# Patient Record
Sex: Female | Born: 1955 | Race: White | Hispanic: No | Marital: Married | Smoking: Never smoker
Health system: Southern US, Community
[De-identification: ages and names within clinical notes are randomized; demographics above are authoritative.]

## PROBLEM LIST (undated history)

## (undated) DIAGNOSIS — M1990 Unspecified osteoarthritis, unspecified site: Secondary | ICD-10-CM

## (undated) DIAGNOSIS — Z803 Family history of malignant neoplasm of breast: Secondary | ICD-10-CM

## (undated) DIAGNOSIS — Z923 Personal history of irradiation: Secondary | ICD-10-CM

## (undated) DIAGNOSIS — G43909 Migraine, unspecified, not intractable, without status migrainosus: Secondary | ICD-10-CM

## (undated) DIAGNOSIS — Z8042 Family history of malignant neoplasm of prostate: Secondary | ICD-10-CM

## (undated) DIAGNOSIS — E079 Disorder of thyroid, unspecified: Secondary | ICD-10-CM

## (undated) DIAGNOSIS — Z5189 Encounter for other specified aftercare: Secondary | ICD-10-CM

## (undated) DIAGNOSIS — J189 Pneumonia, unspecified organism: Secondary | ICD-10-CM

## (undated) DIAGNOSIS — E039 Hypothyroidism, unspecified: Secondary | ICD-10-CM

## (undated) DIAGNOSIS — Z800 Family history of malignant neoplasm of digestive organs: Secondary | ICD-10-CM

## (undated) DIAGNOSIS — C50919 Malignant neoplasm of unspecified site of unspecified female breast: Secondary | ICD-10-CM

## (undated) DIAGNOSIS — C50911 Malignant neoplasm of unspecified site of right female breast: Secondary | ICD-10-CM

## (undated) HISTORY — DX: Family history of malignant neoplasm of breast: Z80.3

## (undated) HISTORY — DX: Family history of malignant neoplasm of digestive organs: Z80.0

## (undated) HISTORY — DX: Encounter for other specified aftercare: Z51.89

## (undated) HISTORY — DX: Family history of malignant neoplasm of prostate: Z80.42

## (undated) HISTORY — DX: Disorder of thyroid, unspecified: E07.9

## (undated) HISTORY — PX: BREAST BIOPSY: SHX20

---
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
## 1984-08-13 DIAGNOSIS — IMO0001 Reserved for inherently not codable concepts without codable children: Secondary | ICD-10-CM

## 1984-08-13 DIAGNOSIS — Z5189 Encounter for other specified aftercare: Secondary | ICD-10-CM

## 1984-08-13 HISTORY — DX: Reserved for inherently not codable concepts without codable children: IMO0001

## 1984-08-13 HISTORY — PX: ECTOPIC PREGNANCY SURGERY: SHX613

## 1984-08-13 HISTORY — DX: Encounter for other specified aftercare: Z51.89

## 1998-05-02 ENCOUNTER — Other Ambulatory Visit: Admission: RE | Admit: 1998-05-02 | Discharge: 1998-05-02 | Payer: Self-pay | Admitting: Obstetrics and Gynecology

## 2000-01-16 ENCOUNTER — Encounter: Payer: Self-pay | Admitting: Obstetrics and Gynecology

## 2000-01-16 ENCOUNTER — Encounter: Admission: RE | Admit: 2000-01-16 | Discharge: 2000-01-16 | Payer: Self-pay | Admitting: Obstetrics and Gynecology

## 2001-01-22 ENCOUNTER — Encounter: Payer: Self-pay | Admitting: Obstetrics and Gynecology

## 2001-01-22 ENCOUNTER — Encounter: Admission: RE | Admit: 2001-01-22 | Discharge: 2001-01-22 | Payer: Self-pay | Admitting: Obstetrics and Gynecology

## 2002-02-18 ENCOUNTER — Encounter: Payer: Self-pay | Admitting: Obstetrics and Gynecology

## 2002-02-18 ENCOUNTER — Encounter: Admission: RE | Admit: 2002-02-18 | Discharge: 2002-02-18 | Payer: Self-pay | Admitting: Obstetrics and Gynecology

## 2003-02-22 ENCOUNTER — Encounter: Admission: RE | Admit: 2003-02-22 | Discharge: 2003-02-22 | Payer: Self-pay | Admitting: Obstetrics and Gynecology

## 2003-02-22 ENCOUNTER — Encounter: Payer: Self-pay | Admitting: Obstetrics and Gynecology

## 2004-02-28 ENCOUNTER — Encounter: Admission: RE | Admit: 2004-02-28 | Discharge: 2004-02-28 | Payer: Self-pay | Admitting: Obstetrics and Gynecology

## 2005-03-02 ENCOUNTER — Encounter: Admission: RE | Admit: 2005-03-02 | Discharge: 2005-03-02 | Payer: Self-pay | Admitting: Obstetrics and Gynecology

## 2005-03-15 ENCOUNTER — Encounter: Admission: RE | Admit: 2005-03-15 | Discharge: 2005-03-15 | Payer: Self-pay | Admitting: Obstetrics and Gynecology

## 2006-03-08 ENCOUNTER — Encounter: Admission: RE | Admit: 2006-03-08 | Discharge: 2006-03-08 | Payer: Self-pay | Admitting: Obstetrics and Gynecology

## 2006-10-07 ENCOUNTER — Encounter: Admission: RE | Admit: 2006-10-07 | Discharge: 2006-10-07 | Payer: Self-pay | Admitting: Orthopedic Surgery

## 2007-03-10 ENCOUNTER — Encounter: Admission: RE | Admit: 2007-03-10 | Discharge: 2007-03-10 | Payer: Self-pay | Admitting: Obstetrics and Gynecology

## 2007-08-14 DIAGNOSIS — Z923 Personal history of irradiation: Secondary | ICD-10-CM

## 2007-08-14 HISTORY — DX: Personal history of irradiation: Z92.3

## 2007-08-14 HISTORY — PX: BREAST LUMPECTOMY: SHX2

## 2008-03-10 ENCOUNTER — Encounter: Admission: RE | Admit: 2008-03-10 | Discharge: 2008-03-10 | Payer: Self-pay | Admitting: Obstetrics and Gynecology

## 2008-03-18 ENCOUNTER — Encounter: Admission: RE | Admit: 2008-03-18 | Discharge: 2008-03-18 | Payer: Self-pay | Admitting: Obstetrics and Gynecology

## 2008-03-18 ENCOUNTER — Encounter (INDEPENDENT_AMBULATORY_CARE_PROVIDER_SITE_OTHER): Payer: Self-pay | Admitting: Diagnostic Radiology

## 2008-03-30 ENCOUNTER — Encounter: Admission: RE | Admit: 2008-03-30 | Discharge: 2008-03-30 | Payer: Self-pay | Admitting: Obstetrics and Gynecology

## 2008-04-21 ENCOUNTER — Ambulatory Visit (HOSPITAL_BASED_OUTPATIENT_CLINIC_OR_DEPARTMENT_OTHER): Admission: RE | Admit: 2008-04-21 | Discharge: 2008-04-21 | Payer: Self-pay | Admitting: General Surgery

## 2008-04-21 ENCOUNTER — Encounter (INDEPENDENT_AMBULATORY_CARE_PROVIDER_SITE_OTHER): Payer: Self-pay | Admitting: General Surgery

## 2008-04-21 ENCOUNTER — Encounter: Admission: RE | Admit: 2008-04-21 | Discharge: 2008-04-21 | Payer: Self-pay | Admitting: General Surgery

## 2008-05-10 ENCOUNTER — Ambulatory Visit: Payer: Self-pay | Admitting: Oncology

## 2008-05-25 ENCOUNTER — Ambulatory Visit: Admission: RE | Admit: 2008-05-25 | Discharge: 2008-08-03 | Payer: Self-pay | Admitting: Radiation Oncology

## 2008-07-19 ENCOUNTER — Ambulatory Visit: Payer: Self-pay | Admitting: Oncology

## 2008-07-21 LAB — COMPREHENSIVE METABOLIC PANEL
ALT: 16 U/L (ref 0–35)
Albumin: 4.4 g/dL (ref 3.5–5.2)
Alkaline Phosphatase: 53 U/L (ref 39–117)
CO2: 25 mEq/L (ref 19–32)
Calcium: 9.8 mg/dL (ref 8.4–10.5)
Creatinine, Ser: 0.82 mg/dL (ref 0.40–1.20)
Glucose, Bld: 66 mg/dL — ABNORMAL LOW (ref 70–99)
Potassium: 4.3 mEq/L (ref 3.5–5.3)

## 2008-07-21 LAB — CBC WITH DIFFERENTIAL/PLATELET
Basophils Absolute: 0 10*3/uL (ref 0.0–0.1)
EOS%: 4.4 % (ref 0.0–7.0)
MCH: 30.1 pg (ref 26.0–34.0)
MONO#: 0.3 10*3/uL (ref 0.1–0.9)
MONO%: 7.6 % (ref 0.0–13.0)
NEUT#: 2.6 10*3/uL (ref 1.5–6.5)
Platelets: 292 10*3/uL (ref 145–400)
RDW: 13.7 % (ref 11.3–14.5)
WBC: 4 10*3/uL (ref 3.9–10.0)

## 2009-03-25 ENCOUNTER — Encounter: Admission: RE | Admit: 2009-03-25 | Discharge: 2009-03-25 | Payer: Self-pay | Admitting: General Surgery

## 2009-08-25 IMAGING — MG MM SCREEN MAMMOGRAM BILATERAL
4 series · 4 of 4 positions shown · non-contrast
Comparison: none

DG SCREEN MAMMOGRAM BILATERAL
Bilateral CC and MLO view(s) were taken.

DIGITAL SCREENING MAMMOGRAM WITH CAD:
There are scattered fibroglandular densities.  Microcalcifications are present in the right breast.
Characterization with magnification views is recommended.  No mass or malignant type 
calcifications are identified in the left breast.  Compared with prior studies.

[R CC]
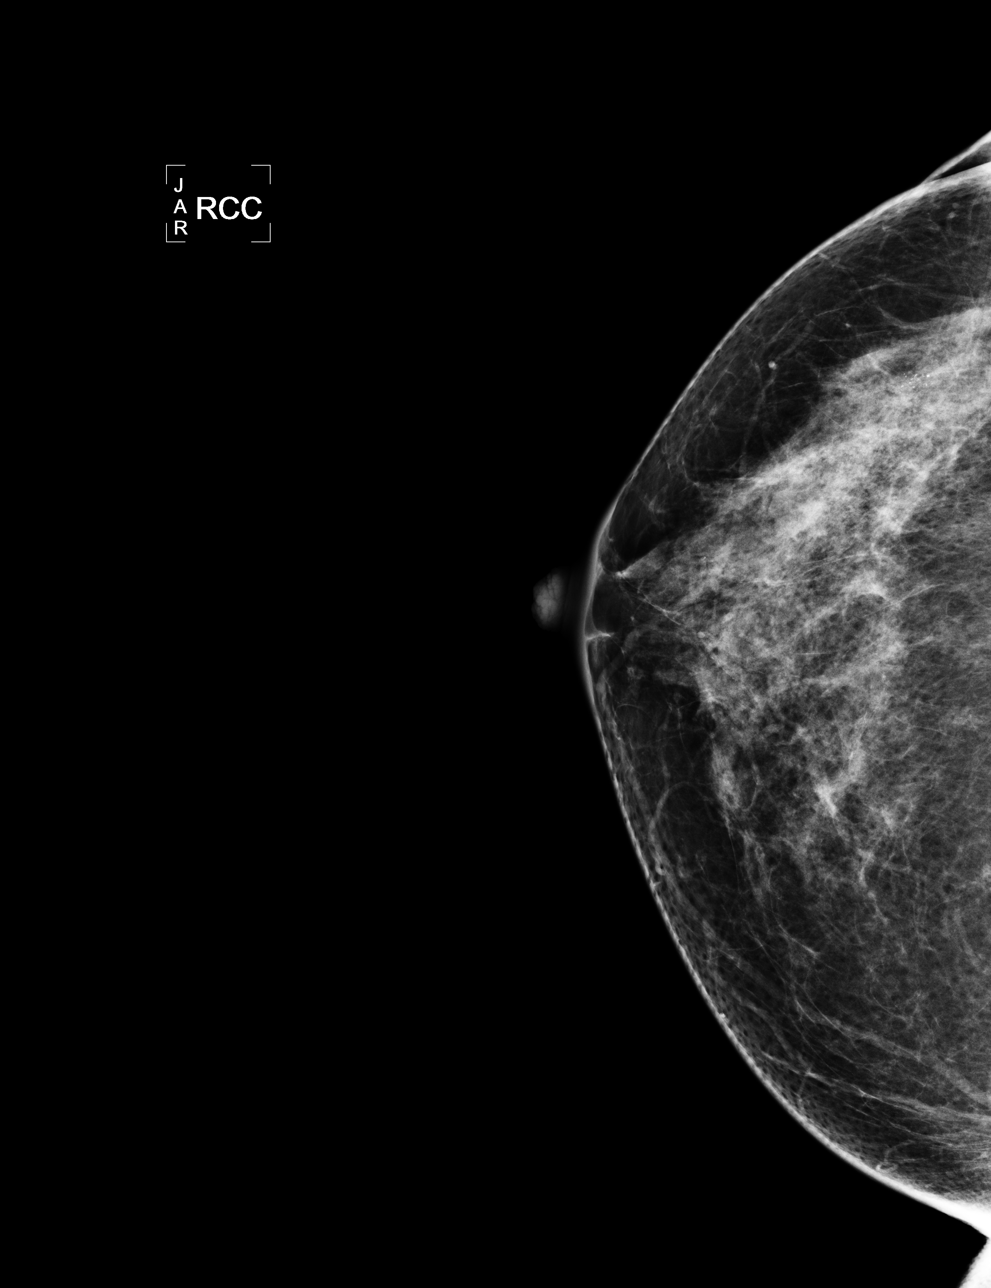

[L CC]
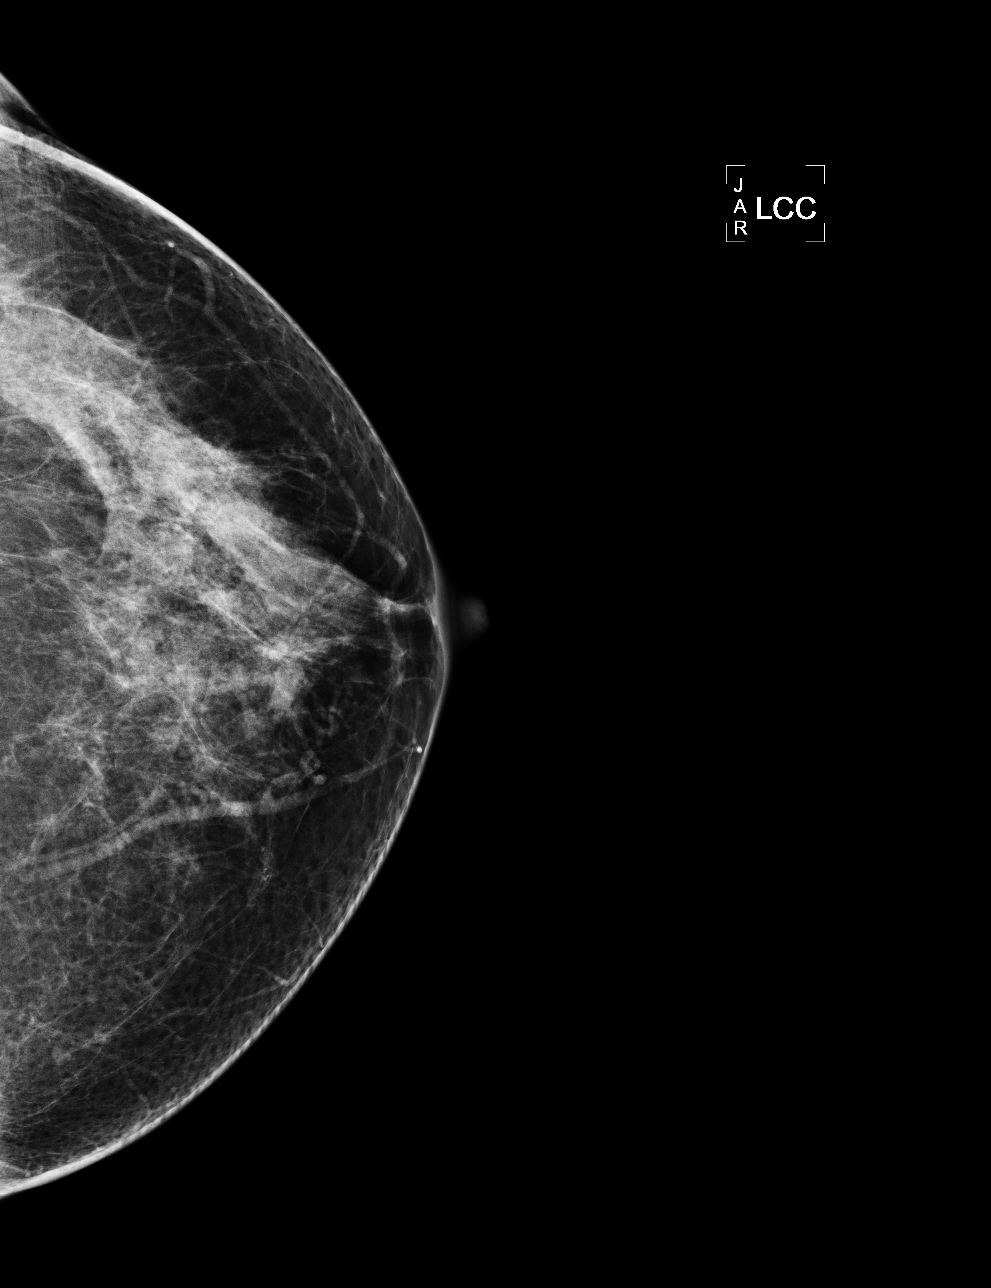

[L MLO]
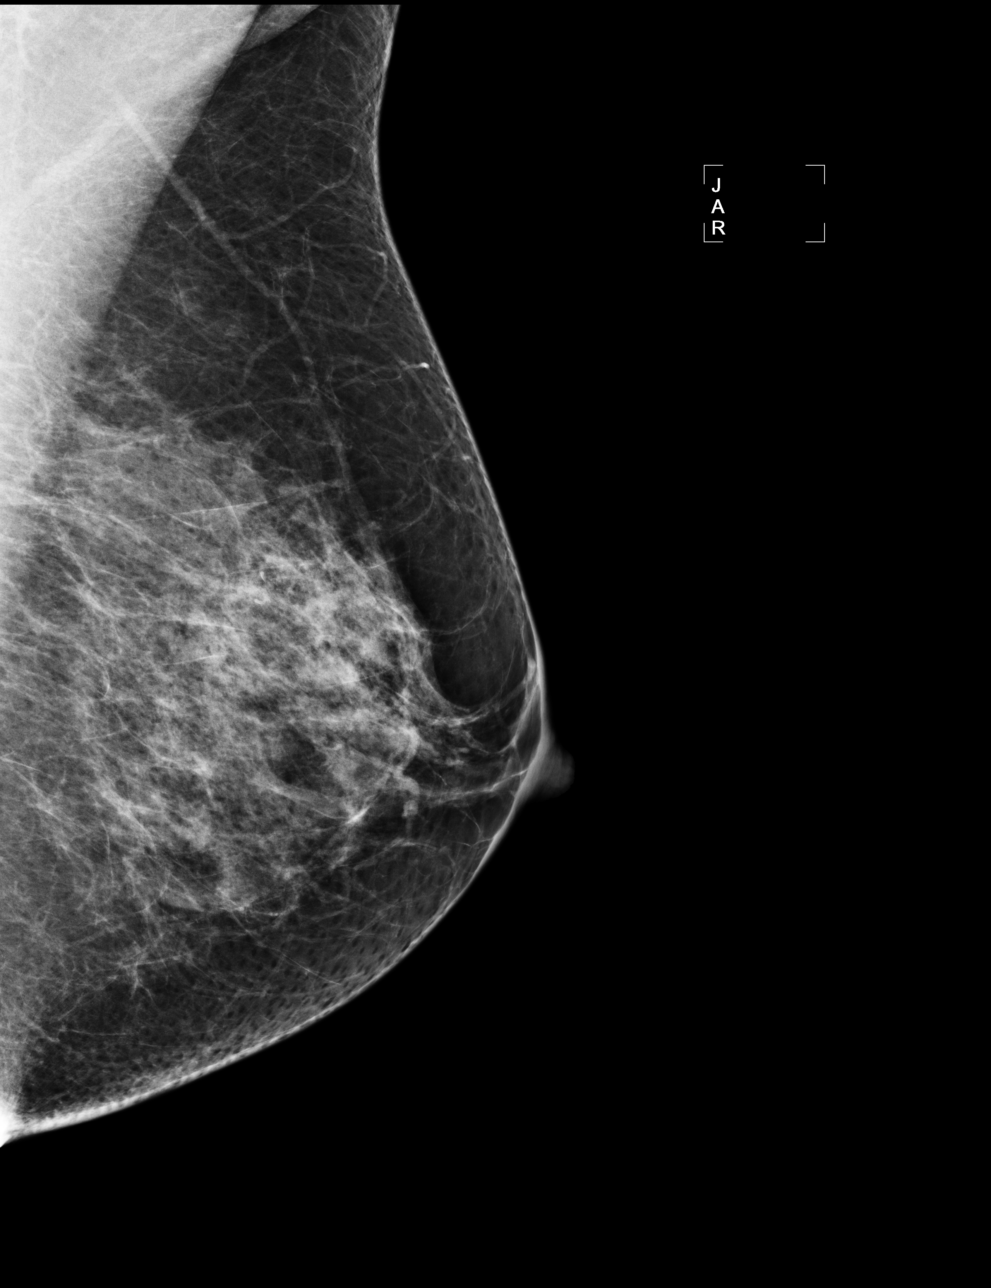

[R MLO]
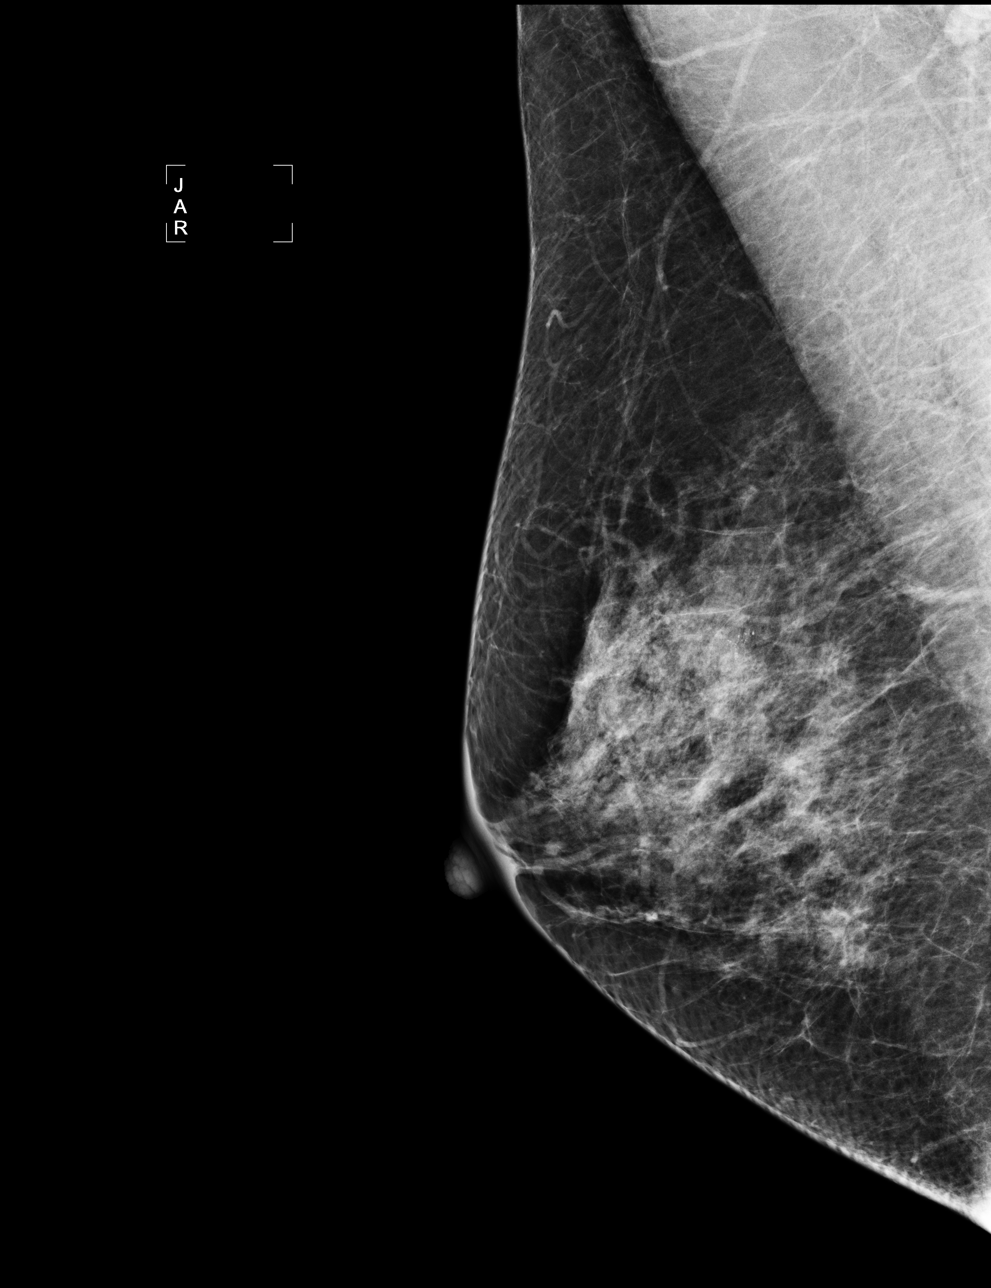

[4 of 4 positions shown; findings below may reference images not displayed]

IMPRESSION: Calcifications, right breast.  Additional evaluation is indicated. The patient will be contacted 
for additional studies and a supplementary report will follow.  No specific mammographic evidence 
of malignancy, left breast.

ASSESSMENT: Need additional imaging evaluation and/or prior mammograms for comparison - BI-RADS 0

Further imaging of the right breast.
ANALYZED BY COMPUTER AIDED DETECTION. , THIS PROCEDURE WAS A DIGITAL MAMMOGRAM.

## 2009-09-02 IMAGING — MG MM DIAGNOSTIC LTD RIGHT
3 series · 3 of 3 positions shown · non-contrast
Comparison: 03/10/2007 and 03/08/2006

CLINICAL DATA: The patient returns for evaluation of calcifications
in the right breast noted on recent screening study dated
03/10/2008.

DIGITAL DIAGNOSTIC RIGHT LIMITED MAMMOGRAM ]

[R CC (1 of 2)]
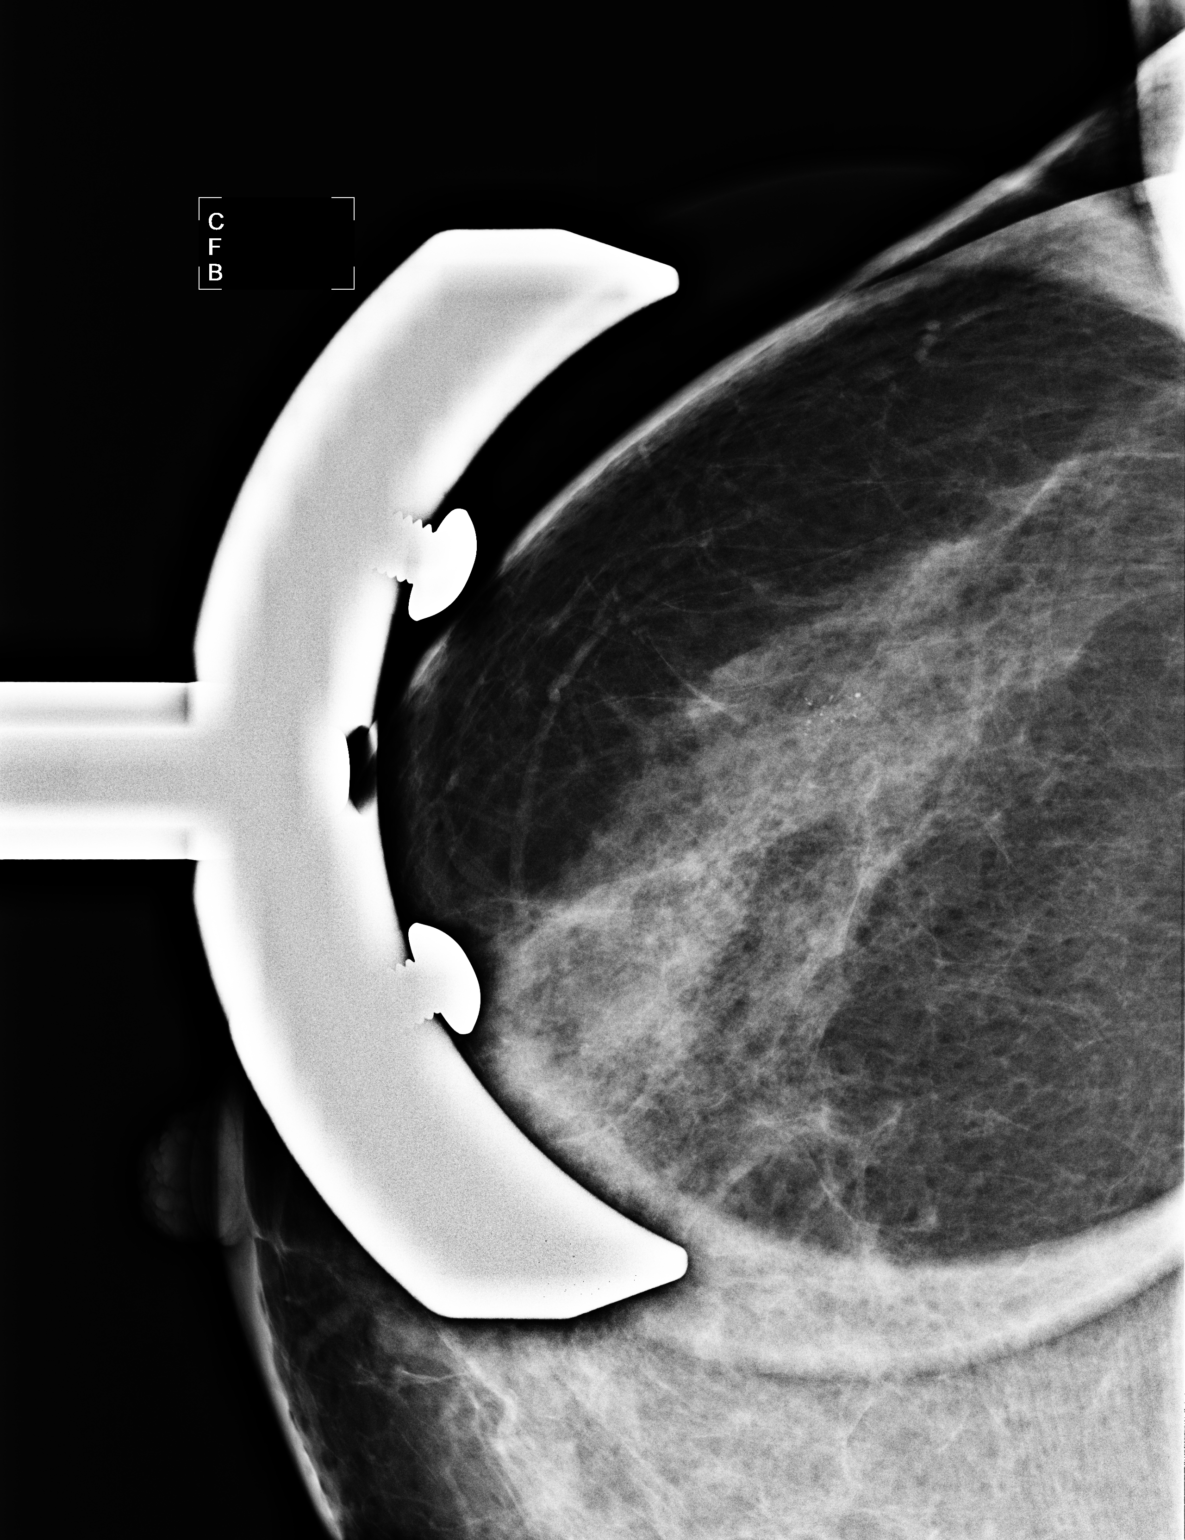

[R CC (2 of 2)]
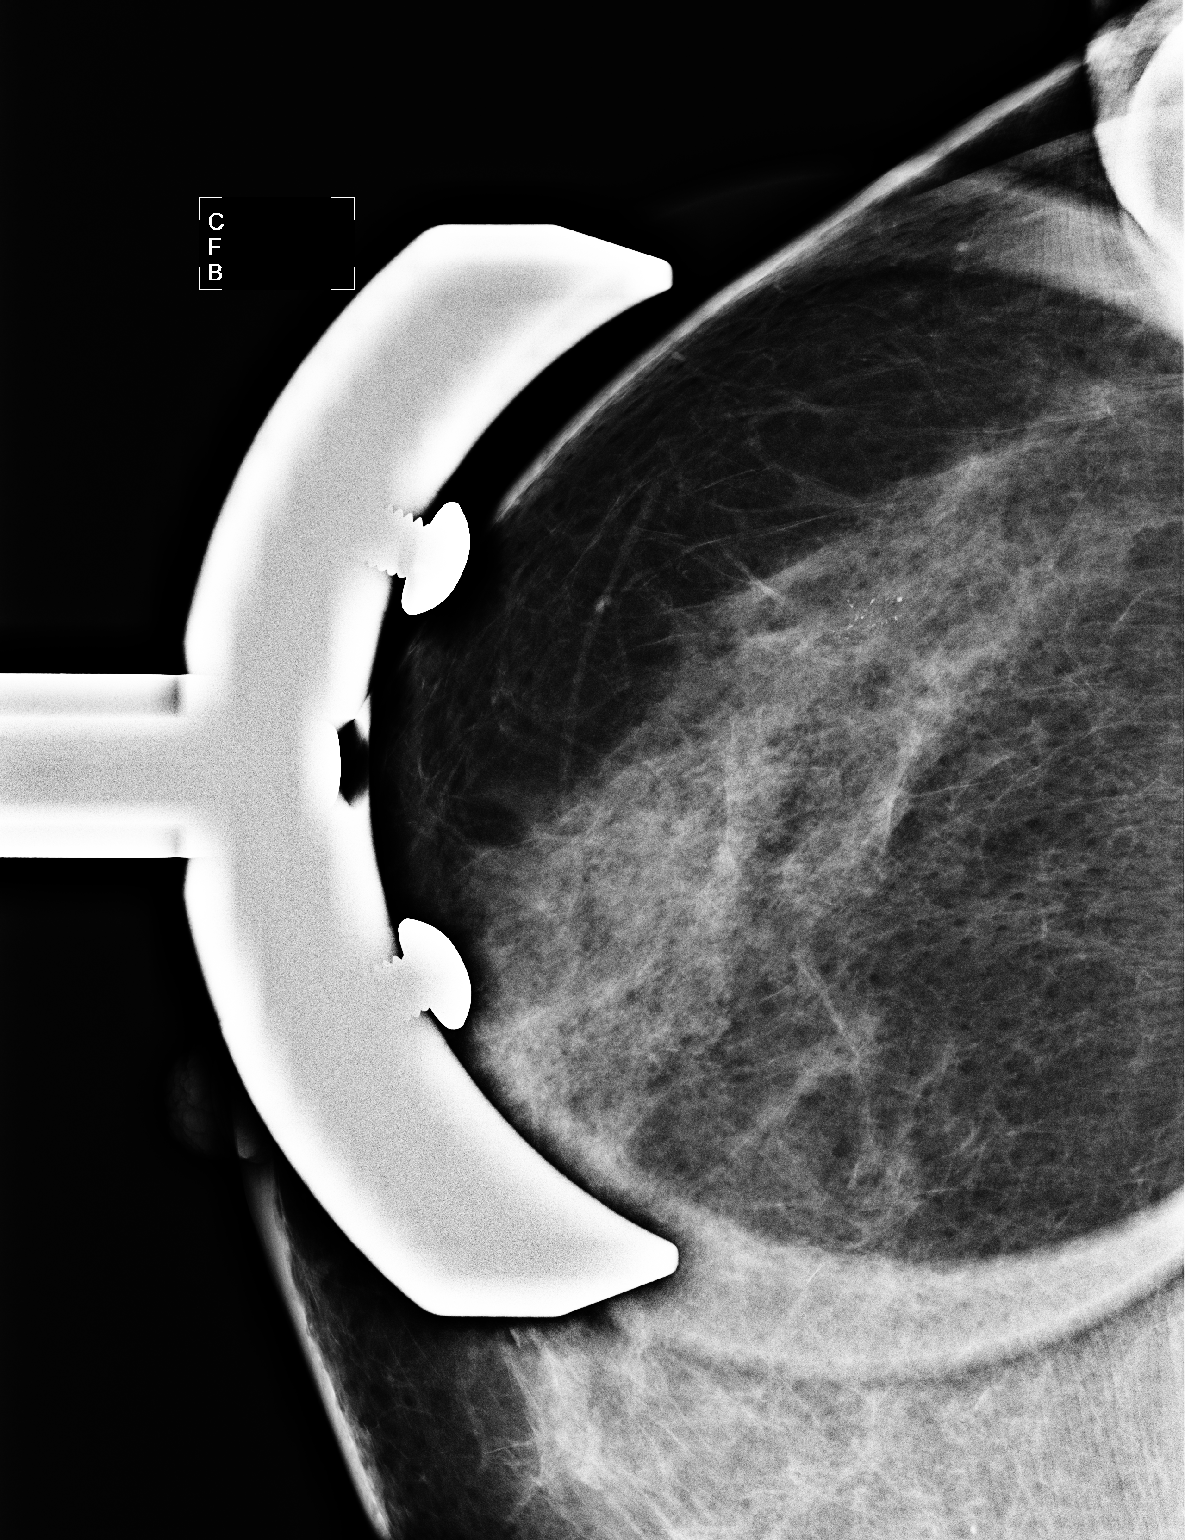

[R ML]
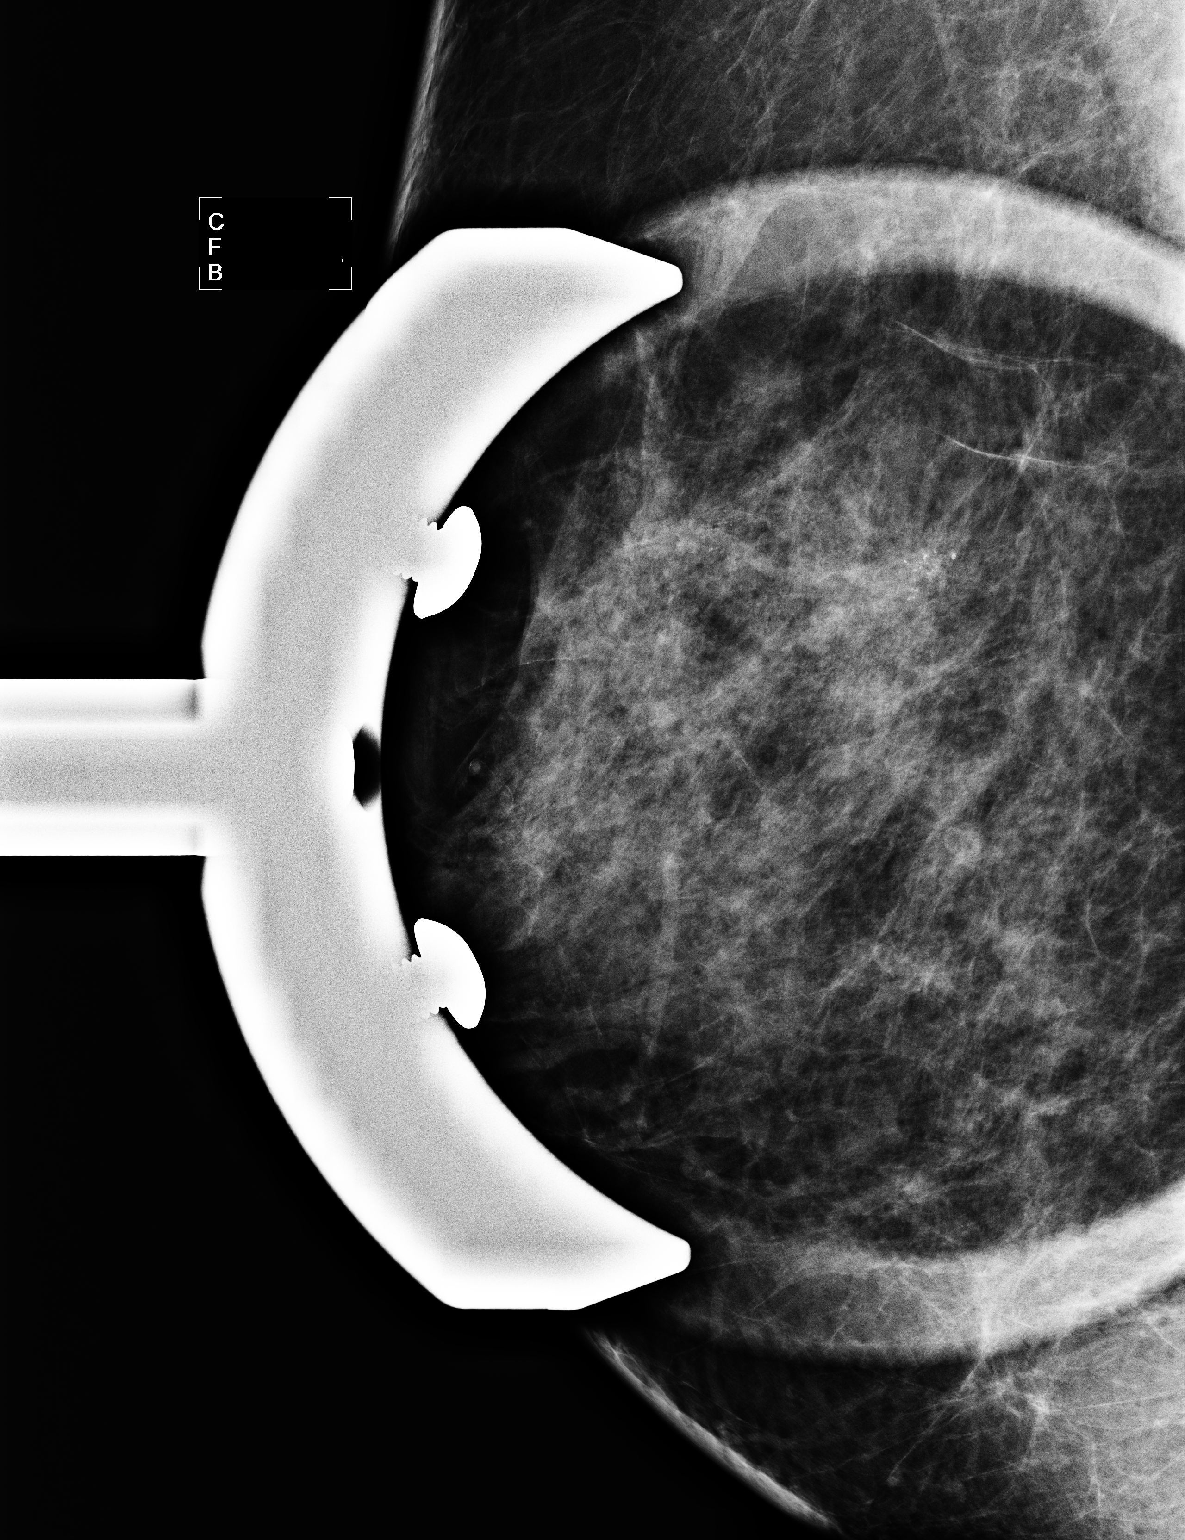

[3 of 3 positions shown; findings below may reference images not displayed]

FINDINGS: Magnification views demonstrate a cluster of pleomorphic
microcalcifications in the right upper outer quadrant posteriorly
measuring approximately 6 x 6 x 8 mm.  The appearance is concerning
for the possibility of ductal carcinoma in situ and biopsy is
recommended.

Options of surgical excisional biopsy and stereotactic core needle
biopsy were discussed with the patient.  The patient elected to
proceed with stereotactic core needle biopsy which will be
performed and reported separately.
IMPRESSION: Suspicious cluster of pleomorphic microcalcifications in the right
upper outer quadrant posteriorly.  Biopsy is recommended.

Report was telephoned to Donis at Dr. [REDACTED].

BI-RADS CATEGORY 5:  Highly suggestive of malignancy - appropriate
action should be taken.

## 2010-03-27 ENCOUNTER — Encounter: Admission: RE | Admit: 2010-03-27 | Discharge: 2010-03-27 | Payer: Self-pay | Admitting: General Surgery

## 2010-04-26 ENCOUNTER — Encounter: Admission: RE | Admit: 2010-04-26 | Discharge: 2010-04-26 | Payer: Self-pay | Admitting: General Surgery

## 2010-09-04 ENCOUNTER — Encounter: Payer: Self-pay | Admitting: Obstetrics and Gynecology

## 2010-12-15 ENCOUNTER — Encounter (INDEPENDENT_AMBULATORY_CARE_PROVIDER_SITE_OTHER): Payer: Self-pay | Admitting: Neurology

## 2010-12-26 NOTE — Op Note (Signed)
NAME:  Webster, Kim               ACCOUNT NO.:  [id number]   MEDICAL RECORD NO.:  [id number]          PATIENT TYPE:  AMB   LOCATION:  [facility]SC                          FACILITY:  [facility]   PHYSICIAN:  Sharlet Salina T. Hoxworth, M.D.DATE OF BIRTH:  June 11, 1956   DATE OF PROCEDURE:  04/21/2008  DATE OF DISCHARGE:                               OPERATIVE REPORT   PREOPERATIVE DIAGNOSIS:  Ductal carcinoma in situ, right breast.   POSTOPERATIVE DIAGNOSIS:  Ductal carcinoma in situ, right breast.   SURGICAL PROCEDURE:  Needle-localized right breast lumpectomy.   SURGEON:  Lorne Skeens. Hoxworth, MD   ANESTHESIA:  General.   BRIEF HISTORY:  Kim Webster is a 55 year old female with a family  history of breast cancer.  Recent screening mammogram showed a sub-  centimeter cluster of pleomorphic calcifications in the lateral right  breast.  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  I have recommended proceeding with needle-localized lumpectomy.  The  nature of procedure, indications, risks of bleeding, infection, and  possible need for further surgery based on pathology findings have been  discussed and understood and is now brought to the operating room for  this procedure.   DESCRIPTION OF OPERATION:  Following successful needle localization, the  patient was brought to the operating room and placed in supine position  on the operating table and general endotracheal anesthesia was induced.  She received preoperative IV antibiotics.  The right breast was widely  sterilely prepped and draped.  Correct patient and procedure were  verified.  A curvilinear incision was made at the wire insertion site  laterally and dissection was carried down into the subcutaneous tissue.  A generous core of breast tissue was then excised around the shaft of  the wire.  The breast tissue was very dense, but there was no discrete  mass.  This was carried medially down essentially to the chest Meares.  The specimen was then oriented with inked margins and specimen x-ray  showed the marker clip and calcifications to be within the specimen.  Hemostasis was obtained with cautery.  The soft tissue was infiltrated  with Marcaine.  The subcu was then closed with interrupted 5-0 Monocryl  and the skin with subcuticular 5-0 Monocryl and Dermabond.  Sponge,  needle, and instrument counts were correct.  The patient was taken to  the recovery in good condition.      Lorne Skeens. Hoxworth, M.D.  Electronically Signed     BTH/MEDQ  D:  04/21/2008  T:  04/22/2008  Job:  [id number]

## 2011-03-06 ENCOUNTER — Other Ambulatory Visit (INDEPENDENT_AMBULATORY_CARE_PROVIDER_SITE_OTHER): Payer: Self-pay | Admitting: General Surgery

## 2011-03-06 DIAGNOSIS — Z9889 Other specified postprocedural states: Secondary | ICD-10-CM

## 2011-03-29 ENCOUNTER — Ambulatory Visit
Admission: RE | Admit: 2011-03-29 | Discharge: 2011-03-29 | Disposition: A | Discharge status: Home / Self Care | Payer: BC Managed Care – PPO | Source: Ambulatory Visit | Attending: General Surgery | Admitting: General Surgery

## 2011-03-29 DIAGNOSIS — Z9889 Other specified postprocedural states: Secondary | ICD-10-CM

## 2011-06-22 ENCOUNTER — Ambulatory Visit (INDEPENDENT_AMBULATORY_CARE_PROVIDER_SITE_OTHER): Payer: BC Managed Care – PPO | Admitting: General Surgery

## 2011-06-22 ENCOUNTER — Encounter (INDEPENDENT_AMBULATORY_CARE_PROVIDER_SITE_OTHER): Payer: Self-pay | Admitting: General Surgery

## 2011-06-22 VITALS — BP 134/84 | HR 60 | Temp 97.4°F | Resp 16 | Ht 69.0 in | Wt 157.0 lb

## 2011-06-22 DIAGNOSIS — C50519 Malignant neoplasm of lower-outer quadrant of unspecified female breast: Secondary | ICD-10-CM | POA: Insufficient documentation

## 2011-06-22 NOTE — Progress Notes (Signed)
History: Patient returns for long-term followup status post right breast lumpectomy and radiation for DCIS. Surgery date was September 2009. She declined hormonal therapy. She reports no problems, specifically no breast lump, pain, nipple discharge or skin changes.  Exam: Gen.: Within, healthy-appearing female Skin: No rash or infection Lymph nodes: No cervical, supraclavicular or axillary nodes palpable Breasts: Healed lumpectomy site outer right breast with minimal thickening. No masses palpable in either breast. No skin or nipple changes.  Imaging: Mammogram August 2012 showed stable postlumpectomy changes.  Assessment plan: Doing well 3 years following lumpectomy and radiation for DCIS of the right breast. She is doing MRIs next year. Return one year

## 2012-01-21 ENCOUNTER — Other Ambulatory Visit: Payer: Self-pay | Admitting: Obstetrics and Gynecology

## 2012-01-21 DIAGNOSIS — Z9889 Other specified postprocedural states: Secondary | ICD-10-CM

## 2012-04-01 ENCOUNTER — Ambulatory Visit
Admission: RE | Admit: 2012-04-01 | Discharge: 2012-04-01 | Disposition: A | Discharge status: Home / Self Care | Payer: BC Managed Care – PPO | Source: Ambulatory Visit | Attending: Obstetrics and Gynecology | Admitting: Obstetrics and Gynecology

## 2012-04-01 DIAGNOSIS — Z9889 Other specified postprocedural states: Secondary | ICD-10-CM

## 2012-04-30 ENCOUNTER — Other Ambulatory Visit (INDEPENDENT_AMBULATORY_CARE_PROVIDER_SITE_OTHER): Payer: Self-pay

## 2012-04-30 DIAGNOSIS — D0510 Intraductal carcinoma in situ of unspecified breast: Secondary | ICD-10-CM

## 2012-05-17 ENCOUNTER — Ambulatory Visit
Admission: RE | Admit: 2012-05-17 | Discharge: 2012-05-17 | Disposition: A | Discharge status: Home / Self Care | Payer: BC Managed Care – PPO | Source: Ambulatory Visit | Attending: General Surgery | Admitting: General Surgery

## 2012-05-17 DIAGNOSIS — D0510 Intraductal carcinoma in situ of unspecified breast: Secondary | ICD-10-CM

## 2012-05-17 MED ORDER — GADOBENATE DIMEGLUMINE 529 MG/ML IV SOLN
15.0000 mL | Freq: Once | INTRAVENOUS | Status: AC | PRN
  Administered 2012-05-17: 15 mL via INTRAVENOUS

## 2012-07-03 ENCOUNTER — Ambulatory Visit (INDEPENDENT_AMBULATORY_CARE_PROVIDER_SITE_OTHER): Payer: BC Managed Care – PPO | Admitting: General Surgery

## 2012-08-22 ENCOUNTER — Ambulatory Visit (INDEPENDENT_AMBULATORY_CARE_PROVIDER_SITE_OTHER): Payer: BC Managed Care – PPO | Admitting: General Surgery

## 2012-08-22 ENCOUNTER — Encounter (INDEPENDENT_AMBULATORY_CARE_PROVIDER_SITE_OTHER): Payer: Self-pay | Admitting: General Surgery

## 2012-08-22 VITALS — BP 116/78 | HR 70 | Resp 16 | Ht 69.0 in | Wt 172.0 lb

## 2012-08-22 DIAGNOSIS — C50519 Malignant neoplasm of lower-outer quadrant of unspecified female breast: Secondary | ICD-10-CM

## 2012-08-22 NOTE — Progress Notes (Signed)
Chief complaint: Followup DCIS  History: patient returns for routine followup with a history of right breast lumpectomy and radiation, declined for months, for DCIS of the right breast with surgery date of September 2009. She reports no specific problems i.e. No lumps, pain, nipple discharge or skin changes.  Exam: BP 116/78  Pulse 70  Resp 16  Ht 5\' 9"  (1.753 m)  Wt 172 lb (78.019 kg)  BMI 25.40 kg/m2 General: Healthy-appearing Caucasian female Lymph nodes: No cervical, subclavicular or axillary nodes palpable Breasts: Well-healed lumpectomy incision outer right breast. No thickening or mass here or elsewhere in either breast. No skin changes.  Imaging: Recent bilateral breast MRI was negative. Mammogram due in August.  Assessment and plan: Doing well with no evidence of recurrent or new breast cancer. Return in 6 months.

## 2013-02-23 ENCOUNTER — Other Ambulatory Visit (INDEPENDENT_AMBULATORY_CARE_PROVIDER_SITE_OTHER): Payer: Self-pay | Admitting: General Surgery

## 2013-02-23 DIAGNOSIS — Z853 Personal history of malignant neoplasm of breast: Secondary | ICD-10-CM

## 2013-03-05 ENCOUNTER — Ambulatory Visit (INDEPENDENT_AMBULATORY_CARE_PROVIDER_SITE_OTHER): Payer: BC Managed Care – PPO | Admitting: General Surgery

## 2013-03-06 ENCOUNTER — Ambulatory Visit (INDEPENDENT_AMBULATORY_CARE_PROVIDER_SITE_OTHER): Payer: BC Managed Care – PPO | Admitting: General Surgery

## 2013-04-08 ENCOUNTER — Ambulatory Visit
Admission: RE | Admit: 2013-04-08 | Discharge: 2013-04-08 | Disposition: A | Discharge status: Home / Self Care | Payer: BC Managed Care – PPO | Source: Ambulatory Visit | Attending: General Surgery | Admitting: General Surgery

## 2013-04-08 DIAGNOSIS — Z853 Personal history of malignant neoplasm of breast: Secondary | ICD-10-CM

## 2013-04-09 ENCOUNTER — Encounter (INDEPENDENT_AMBULATORY_CARE_PROVIDER_SITE_OTHER): Payer: Self-pay | Admitting: General Surgery

## 2013-04-09 ENCOUNTER — Ambulatory Visit (INDEPENDENT_AMBULATORY_CARE_PROVIDER_SITE_OTHER): Payer: BC Managed Care – PPO | Admitting: General Surgery

## 2013-04-09 VITALS — BP 110/78 | HR 56 | Resp 14 | Ht 69.0 in | Wt 174.6 lb

## 2013-04-09 DIAGNOSIS — C50519 Malignant neoplasm of lower-outer quadrant of unspecified female breast: Secondary | ICD-10-CM

## 2013-04-09 DIAGNOSIS — C50511 Malignant neoplasm of lower-outer quadrant of right female breast: Secondary | ICD-10-CM

## 2013-04-09 NOTE — Progress Notes (Signed)
Chief complaint: Followup DCIS  History: patient returns for routine followup with a history of right breast lumpectomy and radiation, declined Hormonal therapy, for DCIS of the right breast with surgery date of September 2009. She reports no specific problems i.e. No lumps, pain, nipple discharge or skin changes.  Exam: BP 110/78  Pulse 56  Resp 14  Ht 5\' 9"  (1.753 m)  Wt 174 lb 9.6 oz (79.198 kg)  BMI 25.77 kg/m2 General: Healthy-appearing Caucasian female Lymph nodes: No cervical, subclavicular or axillary nodes palpable Breasts: Well-healed lumpectomy incision outer right breast. No thickening or mass here or elsewhere in either breast. No skin changes.  Imaging: Mammogram this month was negative. MRI last year negative.  Assessment and plan: Doing well with no evidence of recurrent or new breast cancer 5 years post treatment. At this point I will discharge her from routine followup. She understands to continue routine screening imaging and self-exam a nearly physician exam.

## 2013-06-18 ENCOUNTER — Other Ambulatory Visit: Payer: Self-pay

## 2014-03-04 ENCOUNTER — Other Ambulatory Visit: Payer: Self-pay | Admitting: Obstetrics and Gynecology

## 2014-03-04 DIAGNOSIS — Z853 Personal history of malignant neoplasm of breast: Secondary | ICD-10-CM

## 2014-04-13 ENCOUNTER — Ambulatory Visit
Admission: RE | Admit: 2014-04-13 | Discharge: 2014-04-13 | Disposition: A | Discharge status: Home / Self Care | Payer: BC Managed Care – PPO | Source: Ambulatory Visit | Attending: Obstetrics and Gynecology | Admitting: Obstetrics and Gynecology

## 2014-04-13 DIAGNOSIS — Z853 Personal history of malignant neoplasm of breast: Secondary | ICD-10-CM

## 2014-04-21 ENCOUNTER — Other Ambulatory Visit: Payer: Self-pay | Admitting: Obstetrics and Gynecology

## 2014-04-22 LAB — CYTOLOGY - PAP

## 2014-05-05 ENCOUNTER — Other Ambulatory Visit (INDEPENDENT_AMBULATORY_CARE_PROVIDER_SITE_OTHER): Payer: Self-pay

## 2014-05-05 DIAGNOSIS — R922 Inconclusive mammogram: Secondary | ICD-10-CM

## 2014-05-05 DIAGNOSIS — Z853 Personal history of malignant neoplasm of breast: Secondary | ICD-10-CM

## 2014-05-08 ENCOUNTER — Ambulatory Visit
Admission: RE | Admit: 2014-05-08 | Discharge: 2014-05-08 | Disposition: A | Discharge status: Home / Self Care | Payer: BC Managed Care – PPO | Source: Ambulatory Visit | Attending: General Surgery | Admitting: General Surgery

## 2014-05-08 DIAGNOSIS — Z853 Personal history of malignant neoplasm of breast: Secondary | ICD-10-CM

## 2014-05-08 DIAGNOSIS — R922 Inconclusive mammogram: Secondary | ICD-10-CM

## 2014-05-08 MED ORDER — GADOBENATE DIMEGLUMINE 529 MG/ML IV SOLN
16.0000 mL | Freq: Once | INTRAVENOUS | Status: AC | PRN
  Administered 2014-05-08: 16 mL via INTRAVENOUS

## 2015-03-14 ENCOUNTER — Other Ambulatory Visit: Payer: Self-pay | Admitting: Obstetrics and Gynecology

## 2015-03-14 DIAGNOSIS — Z853 Personal history of malignant neoplasm of breast: Secondary | ICD-10-CM

## 2015-04-20 ENCOUNTER — Ambulatory Visit
Admission: RE | Admit: 2015-04-20 | Discharge: 2015-04-20 | Disposition: A | Discharge status: Home / Self Care | Payer: BC Managed Care – PPO | Source: Ambulatory Visit | Attending: Obstetrics and Gynecology | Admitting: Obstetrics and Gynecology

## 2015-04-20 DIAGNOSIS — Z853 Personal history of malignant neoplasm of breast: Secondary | ICD-10-CM

## 2016-03-16 ENCOUNTER — Other Ambulatory Visit: Payer: Self-pay | Admitting: General Surgery

## 2016-03-16 DIAGNOSIS — Z1231 Encounter for screening mammogram for malignant neoplasm of breast: Secondary | ICD-10-CM

## 2016-03-22 ENCOUNTER — Other Ambulatory Visit: Payer: Self-pay | Admitting: General Surgery

## 2016-03-22 DIAGNOSIS — Z853 Personal history of malignant neoplasm of breast: Secondary | ICD-10-CM

## 2016-04-20 ENCOUNTER — Ambulatory Visit
Admission: RE | Admit: 2016-04-20 | Discharge: 2016-04-20 | Disposition: A | Discharge status: Home / Self Care | Payer: BC Managed Care – PPO | Source: Ambulatory Visit | Attending: General Surgery | Admitting: General Surgery

## 2016-04-20 ENCOUNTER — Other Ambulatory Visit: Payer: Self-pay | Admitting: General Surgery

## 2016-04-20 DIAGNOSIS — Z853 Personal history of malignant neoplasm of breast: Secondary | ICD-10-CM

## 2016-04-20 DIAGNOSIS — Z1231 Encounter for screening mammogram for malignant neoplasm of breast: Secondary | ICD-10-CM

## 2016-04-30 ENCOUNTER — Ambulatory Visit
Admission: RE | Admit: 2016-04-30 | Discharge: 2016-04-30 | Disposition: A | Discharge status: Home / Self Care | Payer: BC Managed Care – PPO | Source: Ambulatory Visit | Attending: General Surgery | Admitting: General Surgery

## 2016-04-30 DIAGNOSIS — Z853 Personal history of malignant neoplasm of breast: Secondary | ICD-10-CM

## 2016-04-30 MED ORDER — GADOBENATE DIMEGLUMINE 529 MG/ML IV SOLN
17.0000 mL | Freq: Once | INTRAVENOUS | Status: AC | PRN
  Administered 2016-04-30: 17 mL via INTRAVENOUS

## 2016-04-30 MED ORDER — GADOBENATE DIMEGLUMINE 529 MG/ML IV SOLN: 17.0000 mL | Freq: Once | INTRAVENOUS | Status: DC | PRN

## 2016-05-03 ENCOUNTER — Other Ambulatory Visit: Payer: Self-pay | Admitting: General Surgery

## 2016-05-03 DIAGNOSIS — C50911 Malignant neoplasm of unspecified site of right female breast: Secondary | ICD-10-CM

## 2016-05-09 ENCOUNTER — Ambulatory Visit
Admission: RE | Admit: 2016-05-09 | Discharge: 2016-05-09 | Disposition: A | Discharge status: Home / Self Care | Payer: BC Managed Care – PPO | Source: Ambulatory Visit | Attending: General Surgery | Admitting: General Surgery

## 2016-05-09 DIAGNOSIS — C50911 Malignant neoplasm of unspecified site of right female breast: Secondary | ICD-10-CM

## 2016-05-11 ENCOUNTER — Other Ambulatory Visit: Payer: BC Managed Care – PPO

## 2016-05-11 ENCOUNTER — Other Ambulatory Visit: Payer: Self-pay | Admitting: General Surgery

## 2016-05-11 DIAGNOSIS — R928 Other abnormal and inconclusive findings on diagnostic imaging of breast: Secondary | ICD-10-CM

## 2016-05-15 ENCOUNTER — Ambulatory Visit
Admission: RE | Admit: 2016-05-15 | Discharge: 2016-05-15 | Disposition: A | Discharge status: Home / Self Care | Payer: BC Managed Care – PPO | Source: Ambulatory Visit | Attending: General Surgery | Admitting: General Surgery

## 2016-05-15 DIAGNOSIS — R928 Other abnormal and inconclusive findings on diagnostic imaging of breast: Secondary | ICD-10-CM

## 2016-05-15 MED ORDER — GADOBENATE DIMEGLUMINE 529 MG/ML IV SOLN
17.0000 mL | Freq: Once | INTRAVENOUS | Status: AC | PRN
  Administered 2016-05-15: 17 mL via INTRAVENOUS

## 2016-05-23 ENCOUNTER — Encounter: Payer: Self-pay | Admitting: Genetic Counselor

## 2016-06-01 ENCOUNTER — Telehealth: Payer: Self-pay | Admitting: Hematology

## 2016-06-01 ENCOUNTER — Encounter: Payer: Self-pay | Admitting: Hematology

## 2016-06-01 NOTE — Telephone Encounter (Signed)
Appt scheduled w/Feng on 11/6 @11am . Pt aware to arrive 30 minutes early. Demographics verified. Msg sent to Roma Kayser to see if the patient can have genetic counseling appt on the same day. Letter mailed to pt.

## 2016-06-04 ENCOUNTER — Encounter: Payer: Self-pay | Admitting: Genetic Counselor

## 2016-06-04 ENCOUNTER — Telehealth: Payer: Self-pay | Admitting: Genetic Counselor

## 2016-06-04 NOTE — Telephone Encounter (Signed)
Appt scheduled w/Karen Florene Glen on 11/1 at 11am. Pt aware to arrive 30 minutes early. Letter mailed to the pt.

## 2016-06-13 ENCOUNTER — Ambulatory Visit (HOSPITAL_BASED_OUTPATIENT_CLINIC_OR_DEPARTMENT_OTHER): Payer: BC Managed Care – PPO | Admitting: Genetic Counselor

## 2016-06-13 ENCOUNTER — Encounter: Payer: Self-pay | Admitting: Genetic Counselor

## 2016-06-13 ENCOUNTER — Other Ambulatory Visit: Payer: BC Managed Care – PPO

## 2016-06-13 DIAGNOSIS — Z800 Family history of malignant neoplasm of digestive organs: Secondary | ICD-10-CM | POA: Diagnosis not present

## 2016-06-13 DIAGNOSIS — Z8042 Family history of malignant neoplasm of prostate: Secondary | ICD-10-CM | POA: Diagnosis not present

## 2016-06-13 DIAGNOSIS — C50511 Malignant neoplasm of lower-outer quadrant of right female breast: Secondary | ICD-10-CM

## 2016-06-13 DIAGNOSIS — Z803 Family history of malignant neoplasm of breast: Secondary | ICD-10-CM | POA: Diagnosis not present

## 2016-06-13 DIAGNOSIS — Z170 Estrogen receptor positive status [ER+]: Secondary | ICD-10-CM

## 2016-06-13 NOTE — Progress Notes (Signed)
REFERRING PROVIDER: Marylynn Pearson, MD [address], [address] [place], [place] [address]   Truitt Merle, MD  Excell Seltzer, MD  PRIMARY PROVIDER:  Marylynn Pearson, MD  PRIMARY REASON FOR VISIT:  1. Malignant neoplasm of lower-outer quadrant of right breast of female, estrogen receptor positive ([place])   2. Family history of breast cancer   3. Family history of prostate cancer   4. Family history of pancreatic cancer   5. Family history of colon cancer      HISTORY OF PRESENT ILLNESS:   Kim Webster, a 60 y.o. female, was seen for a Kim Webster cancer genetics consultation at the request of Dr. Burr Medico due to a personal and family history of cancer.  Kim Webster presents to clinic today to discuss the possibility of a hereditary predisposition to cancer, genetic testing, and to further clarify her future cancer risks, as well as potential cancer risks for family members.   In 2009, at the age of 56, Kim Webster was diagnosed with DCIS of the right breast.This was ER+/PR+. This was treated with lumpectomy and radiation.  In 2017, at the age of 21, Kim Webster was diagnosed with DCIS of the right breast.  This will be treated with a mastectomy, but they are waiting on her genetic testing to determine if she will need a double mastectomy.     CANCER HISTORY:   No history exists.     HORMONAL RISK FACTORS:  Menarche was at age 89.  First live birth at age 68.  OCP use for approximately 4 years.  Ovaries intact: yes.  Hysterectomy: no.  Menopausal status: postmenopausal.  HRT use: 0 years. Colonoscopy: yes; normal. Mammogram within the last year: yes. Number of breast biopsies: 2. Up to date with pelvic exams:  yes. Any excessive radiation exposure in the past:  Breast cancer dx  Past Medical History:  Diagnosis Date  . Blood transfusion   . Cancer ([place])   . Ectopic pregnancy 1986   ruptured  . Family history of breast cancer   . Family history of colon cancer   . Family history of  pancreatic cancer   . Family history of prostate cancer   . Thyroid disease    hypothyroism    Past Surgical History:  Procedure Laterality Date  . BREAST LUMPECTOMY  2009   right breast  . [place]    Social History   Social History  . Marital status: Married    Spouse name: N/A  . Number of children: N/A  . Years of education: N/A   Social History Main Topics  . Smoking status: Never Smoker  . Smokeless tobacco: Never Used  . Alcohol use No  . Drug use: No  . Sexual activity: Not on file   Other Topics Concern  . Not on file   Social History Narrative  . No narrative on file     FAMILY HISTORY:  We obtained a detailed, 4-generation family history.  Significant diagnoses are listed below: Family History  Problem Relation Age of Onset  . Cancer Father 58    liver    The patient has one son who is cancer free.  She has a brother and sister who are both cancer free.  The patient's mother was diagnosed with breast cancer at age 45 and again in the other breast at 74.  He mother had two brothers, one who had stomach cancer and the other who had throat cancer.  Both were  smokers.  The patient's father was diagnosed with prostate cancer at 52, bladder cancer at 43 and liver cancer after a diagnosis of Hemachromatosis at 31. He had two sisters, one who had colon cancer at 32.  His mother had pancreatic cancer at 66.  Patient's maternal ancestors are of [place] descent, and paternal ancestors are of [place] and [place] descent. There is no reported Ashkenazi Jewish ancestry. There is no known consanguinity.  GENETIC COUNSELING ASSESSMENT: Kim Webster is a 60 y.o. female with a personal history of breast cancer and family history of breast, prostate, colon and pancreatic cancer which is somewhat suggestive of a hereditary cancer syndrome and predisposition to cancer. We, therefore, discussed and recommended the following at today's visit.   DISCUSSION: We  discussed that about 5-10% of breast cancer is hereditary with most cases due to BRCA mutations.  Other genes can be implicated in hereditary breast cancer syndromes, the ones we see most commonly include PALB2, CHEK2, and ATM. We discussed whether Ms. Krajewski had testing for Hemachromatosis.  She reports that her PCP tested her and she was told that she was negative, but she has not seen that report. We reviewed the characteristics, features and inheritance patterns of hereditary cancer syndromes. We also discussed genetic testing, including the appropriate family members to test, the process of testing, insurance coverage and turn-around-time for results. We discussed the implications of a negative, positive and/or variant of uncertain significant result. We recommended Ms. Tubbs pursue genetic testing for the Breast/Ovarian cancer gene panel. The Breast/Ovarian gene panel offered by [facility] includes sequencing and rearrangement analysis for the following 20 genes:  ATM, BARD1, BRCA1, BRCA2, BRIP1, CDH1, CHEK2, EPCAM, FANCC, MLH1, MSH2, MSH6, NBN, PALB2, PMS2, PTEN, RAD51C, RAD51D, TP53, and XRCC2.     Based on Ms. Red Devil personal and family history of cancer, she meets medical criteria for genetic testing. Despite that she meets criteria, she may still have an out of pocket cost. We discussed that if her out of pocket cost for testing is over $100, the laboratory will call and confirm whether she wants to proceed with testing.  If the out of pocket cost of testing is less than $100 she will be billed by the genetic testing laboratory.   In order to estimate her chance of having a BRCA mutation, we used statistical models Rolanda Jay) and laboratory data that take into account her personal medical history, family history and ancestry.  Because each model is different, there can be a lot of variability in the risks they give.  Therefore, these numbers must be considered a rough range and not a precise risk of having a  BRCA mutation.  These models estimate that she has approximately a 0.06% chance of having a mutation.   PLAN: After considering the risks, benefits, and limitations, Ms. Italiano  provided informed consent to pursue genetic testing and the blood sample was sent to [facility] for analysis of the Breast/Ovarian cancer panel. We placed a RUSH on the results, which should mean that results should be available within approximately 2-3 weeks' time, (hopefully closer to the 2 weeks) at which point they will be disclosed by telephone to Ms. Siegfried, as will any additional recommendations warranted by these results. Ms. Minckler will receive a summary of her genetic counseling visit and a copy of her results once available. This information will also be available in Epic. We encouraged Ms. Servellon to remain in contact with cancer genetics annually so that we can continuously update  the family history and inform her of any changes in cancer genetics and testing that may be of benefit for her family. Ms. Kabel questions were answered to her satisfaction today. Our contact information was provided should additional questions or concerns arise.  Lastly, we encouraged Ms. Laconte to remain in contact with cancer genetics annually so that we can continuously update the family history and inform her of any changes in cancer genetics and testing that may be of benefit for this family.   Ms.  Nanni questions were answered to her satisfaction today. Our contact information was provided should additional questions or concerns arise. Thank you for the referral and allowing [place] to share in the care of your patient.   Karen P. Florene Glen, [place], [facility] Certified Genetic Counselor [email or web address] phone: [phone]  The patient was seen for a total of 60 minutes in face-to-face genetic counseling.  This patient was discussed with Drs. Magrinat, Lindi Adie and/or Burr Medico who agrees with the above.     _______________________________________________________________________ For Office Staff:  Number of people involved in session: 2 Was an Intern/ student involved with case: yes Sheldon Silvan

## 2016-06-17 NOTE — Progress Notes (Signed)
[facility]  [phone] [phone]  Clinic New Consult Note   Patient Care Team: Zelphia Cairo, MD as PCP - General (Obstetrics and Gynecology) 06/18/2016  Referring physician: Dr. Johna Sheriff   CHIEF COMPLAINTS/PURPOSE OF CONSULTATION:  Recurrent right breast DCIS   Oncology History   Breast cancer of upper-outer quadrant of right female breast [facility])   Staging form: Breast, AJCC 7th Edition   - Clinical stage from 05/15/2016: Stage 0 (Tis (DCIS), N0, M0) - Signed by Malachy Mood, MD on 06/18/2016  Cancer of lower-outer quadrant of female breast right TisNoMo S/P lumpetomy, radiation, declined hormones 04/2008   Staging form: Breast, AJCC 7th Edition   - Clinical stage from 04/21/2008: Stage 0 (Tis (DCIS), N0, M0) - Signed by Malachy Mood, MD on 06/18/2016      Cancer of lower-outer quadrant of female breast right TisNoMo S/P lumpetomy, radiation, declined hormones 04/2008   05/16/2011 Initial Diagnosis    Cancer of lower-outer quadrant of female breast right TisNoMo S/P lumpetomy, radiation, declined hormones 04/2008       Breast cancer of upper-outer quadrant of right female breast (HCC)   04/20/2016 Mammogram    Screening mammogram showed no evidence of malignancy. Expected post lumpectomy change in the right breast.      04/30/2016 Imaging    Bilateral breast MRI with and without contrast showed new enhancement measuring 2.3 x 1.2 x 0.8 cm in the upper outer quadrant of the right lumpectomy site and extending anteriorly, suspicious for recurrent DCIS. Interval increase in number and size of level I, 2 and 3 right axillary lymph node, largest 1 cm.      05/09/2016 Imaging    Ultrasound of the right breast and axilla showed no sonographic abnormality, no suspicious adenopathy by ultrasound. Moderately prominent lymph nodes may be reactive and related to recent flu vaccine.      05/15/2016 Initial Biopsy    MRI guided right breast core needle biopsy  showed DCIS with calcifications, intermediate to high-grade.      05/15/2016 Receptors her2    ER 100% positive, PR 100% positive      05/15/2016 Initial Diagnosis    Breast cancer of upper-outer quadrant of right female breast (HCC)       HISTORY OF PRESENTING ILLNESS:  Kim Webster 60 y.o. female is here because of her recently diagnosed recurrent right breast DCIS. She is accompanied by her husband to my clinic today. She was referred by her surgeon Dr. Johna Sheriff.   She has a right breast DCIS, status post lumpectomy and are adjuvant radiation in 2009, she was seen by a medical oncologist Dr. Acquanetta Chain at that time and decided not to pursue chemoprevention due to the limited benefit and concern of side effects. She has been followed by annual screening mammogram since then, her last one was unremarkable in early September 2017. She has been also doing screening breast MRI every 2 years, and she had a repeated MRI on 04/30/2016, which showed a 2.3 cm new enhancement in the upper outer quadrant of right breast, and the previous lumpectomy site. The MRI also showed prominent right axillary lymph nodes. She subsequently underwent ultrasound of the right breast and axilla, which was negative, lymph nodes are slightly prominent, but not suspicious for malignancy. It was felt to be related to her recent flu shot. She underwent MRI guided right breast mass biopsy, which showed DCIS with calcification, intermediate to high-grade, ER and PR strongly positive.  She feels well,  no complains, mild arhtritis in bilateral sounds, no other significant arthralgia. She is a retired Pharmacist, hospital, lives with her husband, remains to be physically active, has good appetite and energy level, no other complaints.  GYN HISTORY  Menarchal: 15 LMP: 89 Contraceptive: 4 years  HRT: none  G3P1: one son 41 yo     MEDICAL HISTORY:  Past Medical History:  Diagnosis Date  . Blood transfusion   . Cancer ([place])   . Ectopic  pregnancy 1986   ruptured  . Family history of breast cancer   . Family history of colon cancer   . Family history of pancreatic cancer   . Family history of prostate cancer   . Thyroid disease    hypothyroism    SURGICAL HISTORY: Past Surgical History:  Procedure Laterality Date  . BREAST LUMPECTOMY  2009   right breast  . ECTOPIC PREGNANCY SURGERY  1986    SOCIAL HISTORY: Social History   Social History  . Marital status: Married    Spouse name: N/A  . Number of children: N/A  . Years of education: N/A   Occupational History  . Not on file.   Social History Main Topics  . Smoking status: Never Smoker  . Smokeless tobacco: Never Used  . Alcohol use No  . Drug use: No  . Sexual activity: Not on file   Other Topics Concern  . Not on file   Social History Narrative  . No narrative on file    FAMILY HISTORY: Family History  Problem Relation Age of Onset  . Cancer Mother 82    breast cancer  . Cancer Father 46    liver cancer   . Bladder Cancer Father   . Prostate cancer Father   . Cancer Paternal Aunt 53    colon cancer   . Cancer Paternal Grandmother 24    pancreatic cancer     ALLERGIES:  is allergic to vicodin [hydrocodone-acetaminophen].  MEDICATIONS:  Current Outpatient Prescriptions  Medication Sig Dispense Refill  . Calcium Carbonate-Vitamin D (CALTRATE 600+D PO) Take by mouth daily.      Marland Kitchen FLUARIX QUADRIVALENT 0.5 ML injection TO BE ADMINISTERED BY PHARMACIST FOR IMMUNIZATION  0  . Levothyroxine Sodium (SYNTHROID PO) Take 88 mcg by mouth daily.     . mometasone (ELOCON) 0.1 % lotion APPLY TO AFFECTED AREA TWICE A DAY  6  . Multiple Vitamins-Minerals (CENTRUM SILVER PO) Take by mouth daily.       No current facility-administered medications for this visit.     REVIEW OF SYSTEMS:   Constitutional: Denies fevers, chills or abnormal night sweats Eyes: Denies blurriness of vision, double vision or watery eyes Ears, nose, mouth, throat, and  face: Denies mucositis or sore throat Respiratory: Denies cough, dyspnea or wheezes Cardiovascular: Denies palpitation, chest discomfort or lower extremity swelling Gastrointestinal:  Denies nausea, heartburn or change in bowel habits Skin: Denies abnormal skin rashes Lymphatics: Denies new lymphadenopathy or easy bruising Neurological:Denies numbness, tingling or new weaknesses Behavioral/Psych: Mood is stable, no new changes  All other systems were reviewed with the patient and are negative.  PHYSICAL EXAMINATION: ECOG PERFORMANCE STATUS: 0 - Asymptomatic  Vitals:   06/18/16 1139  BP: 131/79  Pulse: 73  Resp: 18  Temp: 98.2 F (36.8 C)   Filed Weights   06/18/16 1139  Weight: 186 lb 3.2 oz (84.5 kg)    GENERAL:alert, no distress and comfortable SKIN: skin color, texture, turgor are normal, no rashes or significant lesions EYES:  normal, conjunctiva are pink and non-injected, sclera clear OROPHARYNX:no exudate, no erythema and lips, buccal mucosa, and tongue normal  NECK: supple, thyroid normal size, non-tender, without nodularity LYMPH:  no palpable lymphadenopathy in the cervical, axillary or inguinal LUNGS: clear to auscultation and percussion with normal breathing effort HEART: regular rate & rhythm and no murmurs and no lower extremity edema ABDOMEN:abdomen soft, non-tender and normal bowel sounds Musculoskeletal:no cyanosis of digits and no clubbing  PSYCH: alert & oriented x 3 with fluent speech NEURO: no focal motor/sensory deficits Breasts: Breast inspection showed them to be symmetrical with no nipple discharge. Palpation of the breasts and axilla revealed no obvious mass that I could appreciate.   LABORATORY DATA:  I have reviewed the data as listed CBC Latest Ref Rng & Units 07/21/2008 04/21/2008  WBC 3.9 - 10.0 10e3/uL 4.0 -  Hemoglobin 11.6 - 15.9 g/dL 12.7 13.1 POINT OF CARE RESULT  Hematocrit 34.8 - 46.6 % 37.2 -  Platelets 145 - 400 10e3/uL 292 -   CMP  Latest Ref Rng & Units 07/21/2008  Glucose 70 - 99 mg/dL 66(L)  BUN 6 - 23 mg/dL 18  Creatinine 0.40 - 1.20 mg/dL 0.82  Sodium 135 - 145 mEq/L 140  Potassium 3.5 - 5.3 mEq/L 4.3  Chloride 96 - 112 mEq/L 104  CO2 19 - 32 mEq/L 25  Calcium 8.4 - 10.5 mg/dL 9.8  Total Protein 6.0 - 8.3 g/dL 7.2  Total Bilirubin 0.3 - 1.2 mg/dL 0.2(L)  Alkaline Phos 39 - 117 U/L 53  AST 0 - 37 U/L 22  ALT 0 - 35 U/L 16   PATHOLOGY REPORT  Diagnosis 05/15/2016 Breast, right, needle core biopsy, lateral - DUCTAL CARCINOMA IN SITU WITH CALCIFICATIONS. - FIBROCYSTIC CHANGES WITH ADENOSIS AND CALCIFICATIONS. - SEE COMMENT. The carcinoma appears intermediate to high grade. Estrogen receptor and progesterone receptor studies will be performed and the results reported separately. The results were called to [facility] on 05/16/16. (JBK:gt, 05/16/16)  Results: IMMUNOHISTOCHEMICAL AND MORPHOMETRIC ANALYSIS PERFORMED MANUALLY Estrogen Receptor: 100%, POSITIVE, STRONG STAINING INTENSITY Progesterone Receptor: 100%, POSITIVE, STRONG STAINING INTENSITY    RADIOGRAPHIC STUDIES: I have personally reviewed the radiological images as listed and agreed with the findings in the report. No results found.  ASSESSMENT & PLAN:  60 year old postmenopausal woman, presented with screening discovered DCIS in 04/2016, prior history of right breast DCIS in [place].  1. Breast cancer of upper outer quadrant of right, intermediate to high-grade DCIS, ER+ /PR+ -I discussed her breast imaging and needle biopsy results with patient and her husband in great detail. -This is a local recurrence at the same lumpectomy site from 2009 -Due to her prior breast radiation and the recurrent disease, Dr. Excell Seltzer recommended right mastectomy  -due to her recurrent breast cancer, and family history of multiple cancers, she underwent genetic testing last week, results still pending.  -She is agreeable with bilateral  mastectomy if her genetic testing shows significant mutations, such as BRCA1/2 -Her DCIS will be cured by complete surgical resection. Any form of adjuvant therapy is preventive.  -We discussed cancer prevention of her left breast, if she has right mastectomy  -Given her strongly positive/negative ER and PR, I do/not recommend antiestrogen therapy with  tamoxifen or anastrozole which decrease her risk of future breast cancer by ~40%.  -The potential side effects from tamoxifen or anastrozole, we'll discuss with patient in details, which includes but not limited to, hot flash, skin and vaginal dryness, metabolic changes (  increased blood glucose, cholesterol, weight, etc.), slightly in increased risk of cardiovascular disease, cataracts, slightly increased risk of thrombosis and endometrial cancer with tamoxifen, muscular and joint discomfort, osteopenia and osteoporosis, from anastrozole, etc, were discussed with her in great details. She is interested. Given her recurrent DCIS, intermediate to high-grade, relatively young age, family history of breast cancer, ICD she is a good candidate for chemoprevention. -After a lengthy discussion, she opted anastrozole after surgery if she does not need prophylactic left mastectomy. -We also discussed that biopsy may have sampling limitation, we will review her surgical path, to see if she has any invasive carcinoma components. -We discussed breast cancer surveillance after she completes treatment, Including annual mammogram, breast exam every 6-12 months.  Plan -She is waiting for the genetic testing results, to determine if she has right mastectomy or double mastectomy. -If she has right mastectomy, I'll plan to see her back 3 weeks after her surgery, to finalize her antiestrogen therapy with anastrozole.     All questions were answered. The patient knows to call the clinic with any problems, questions or concerns. I spent 55 minutes counseling the patient face  to face. The total time spent in the appointment was 60 minutes and more than 50% was on counseling.     Truitt Merle, MD 06/18/2016 5:40 PM

## 2016-06-18 ENCOUNTER — Encounter: Payer: Self-pay | Admitting: *Deleted

## 2016-06-18 ENCOUNTER — Ambulatory Visit (HOSPITAL_BASED_OUTPATIENT_CLINIC_OR_DEPARTMENT_OTHER): Payer: BC Managed Care – PPO | Admitting: Hematology

## 2016-06-18 ENCOUNTER — Encounter: Payer: Self-pay | Admitting: Hematology

## 2016-06-18 DIAGNOSIS — D0511 Intraductal carcinoma in situ of right breast: Secondary | ICD-10-CM

## 2016-06-18 DIAGNOSIS — Z86000 Personal history of in-situ neoplasm of breast: Secondary | ICD-10-CM | POA: Diagnosis not present

## 2016-06-18 DIAGNOSIS — C50411 Malignant neoplasm of upper-outer quadrant of right female breast: Secondary | ICD-10-CM

## 2016-06-18 DIAGNOSIS — Z170 Estrogen receptor positive status [ER+]: Secondary | ICD-10-CM

## 2016-06-22 ENCOUNTER — Telehealth: Payer: Self-pay | Admitting: Genetic Counselor

## 2016-06-22 ENCOUNTER — Ambulatory Visit: Payer: Self-pay | Admitting: General Surgery

## 2016-06-22 ENCOUNTER — Encounter: Payer: Self-pay | Admitting: Genetic Counselor

## 2016-06-22 DIAGNOSIS — D0511 Intraductal carcinoma in situ of right breast: Secondary | ICD-10-CM

## 2016-06-22 DIAGNOSIS — Z1379 Encounter for other screening for genetic and chromosomal anomalies: Secondary | ICD-10-CM | POA: Insufficient documentation

## 2016-06-22 NOTE — Telephone Encounter (Signed)
Negative genetic testing on the Breast/Ovarian cancer panel.  We do not know why there is cancer in the family but it does not appear that there is a hereditary cause to her cancer.

## 2016-06-25 ENCOUNTER — Ambulatory Visit: Payer: Self-pay | Admitting: Genetic Counselor

## 2016-06-25 DIAGNOSIS — C50411 Malignant neoplasm of upper-outer quadrant of right female breast: Secondary | ICD-10-CM

## 2016-06-25 DIAGNOSIS — Z800 Family history of malignant neoplasm of digestive organs: Secondary | ICD-10-CM

## 2016-06-25 DIAGNOSIS — Z8042 Family history of malignant neoplasm of prostate: Secondary | ICD-10-CM

## 2016-06-25 DIAGNOSIS — C50511 Malignant neoplasm of lower-outer quadrant of right female breast: Secondary | ICD-10-CM

## 2016-06-25 DIAGNOSIS — Z170 Estrogen receptor positive status [ER+]: Secondary | ICD-10-CM

## 2016-06-25 DIAGNOSIS — Z803 Family history of malignant neoplasm of breast: Secondary | ICD-10-CM

## 2016-06-25 DIAGNOSIS — Z1379 Encounter for other screening for genetic and chromosomal anomalies: Secondary | ICD-10-CM

## 2016-06-26 NOTE — Progress Notes (Signed)
HPI: Ms. Llamas was previously seen in the [facility] due to a personal and family history of cancer and concerns regarding a hereditary predisposition to cancer. Please refer to our prior cancer genetics clinic note for more information regarding Ms. Foxworthy's medical, social and family histories, and our assessment and recommendations, at the time. Ms. Sonnenfeld recent genetic test results were disclosed to her, as were recommendations warranted by these results. These results and recommendations are discussed in more detail below.  FAMILY HISTORY:  We obtained a detailed, 4-generation family history.  Significant diagnoses are listed below: Family History  Problem Relation Age of Onset  . Cancer Mother 31    breast cancer  . Cancer Father 23    liver cancer   . Bladder Cancer Father   . Prostate cancer Father   . Cancer Paternal Aunt 70    colon cancer   . Cancer Paternal Grandmother 108    pancreatic cancer     The patient has one son who is cancer free.  She has a brother and sister who are both cancer free.  The patient's mother was diagnosed with breast cancer at age 29 and again in the other breast at 35.  He mother had two brothers, one who had stomach cancer and the other who had throat cancer.  Both were smokers.  The patient's father was diagnosed with prostate cancer at 9, bladder cancer at 33 and liver cancer after a diagnosis of Hemachromatosis at 71. He had two sisters, one who had colon cancer at 57.  His mother had pancreatic cancer at 68.  Patient's maternal ancestors are of [place] descent, and paternal ancestors are of [place] and [place] descent. There is no reported Ashkenazi Jewish ancestry. There is no known consanguinity.  GENETIC TEST RESULTS: At the time of Ms. Credit's visit, we recommended she pursue genetic testing of the Breast/Ovarian cancer gene panel. The Breast/Ovarian gene panel offered by [facility] includes sequencing and rearrangement analysis for  the following 20 genes:  ATM, BARD1, BRCA1, BRCA2, BRIP1, CDH1, CHEK2, EPCAM, FANCC, MLH1, MSH2, MSH6, NBN, PALB2, PMS2, PTEN, RAD51C, RAD51D, TP53, and XRCC2.   The report date is June 22, 2016.  Genetic testing was normal, and did not reveal a deleterious mutation in these genes. The test report has been scanned into EPIC and is located under the Molecular Pathology section of the Results Review tab.   We discussed with Ms. Gittleman that since the current genetic testing is not perfect, it is possible there may be a gene mutation in one of these genes that current testing cannot detect, but that chance is small. We also discussed, that it is possible that another gene that has not yet been discovered, or that we have not yet tested, is responsible for the cancer diagnoses in the family, and it is, therefore, important to remain in touch with cancer genetics in the future so that we can continue to offer Ms. Birkel the most up to date genetic testing.   CANCER SCREENING RECOMMENDATIONS:  This result is reassuring and indicates that Ms. Ruan likely does not have an increased risk for a future cancer due to a mutation in one of these genes. This normal test also suggests that Ms. Jiron's cancer was most likely not due to an inherited predisposition associated with one of these genes.  Most cancers happen by chance and this negative test suggests that her cancer falls into this category.  We, therefore, recommended she continue  to follow the cancer management and screening guidelines provided by her oncology and primary healthcare provider.   RECOMMENDATIONS FOR FAMILY MEMBERS: Women in this family might be at some increased risk of developing cancer, over the general population risk, simply due to the family history of cancer. We recommended women in this family have a yearly mammogram beginning at age 58, or 63 years younger than the earliest onset of cancer, an annual clinical breast exam, and perform  monthly breast self-exams. Women in this family should also have a gynecological exam as recommended by their primary provider. All family members should have a colonoscopy by age 58.  FOLLOW-UP: Lastly, we discussed with Ms. Bogard that cancer genetics is a rapidly advancing field and it is possible that new genetic tests will be appropriate for her and/or her family members in the future. We encouraged her to remain in contact with cancer genetics on an annual basis so we can update her personal and family histories and let her know of advances in cancer genetics that may benefit this family.   Our contact number was provided. Ms. Benedetti questions were answered to her satisfaction, and she knows she is welcome to call us at anytime with additional questions or concerns.   Roma Kayser, MS, [facility] Certified Genetic Counselor [email or web address]

## 2016-06-29 ENCOUNTER — Encounter: Payer: Self-pay | Admitting: Genetic Counselor

## 2016-07-03 ENCOUNTER — Ambulatory Visit: Payer: Self-pay | Admitting: General Surgery

## 2016-07-04 ENCOUNTER — Encounter (HOSPITAL_COMMUNITY): Payer: Self-pay

## 2016-07-04 ENCOUNTER — Encounter (HOSPITAL_COMMUNITY)
Admission: RE | Admit: 2016-07-04 | Discharge: 2016-07-04 | Disposition: A | Discharge status: Home / Self Care | Payer: BC Managed Care – PPO | Source: Ambulatory Visit | Attending: General Surgery | Admitting: General Surgery

## 2016-07-04 DIAGNOSIS — Z01812 Encounter for preprocedural laboratory examination: Secondary | ICD-10-CM | POA: Insufficient documentation

## 2016-07-04 DIAGNOSIS — D0511 Intraductal carcinoma in situ of right breast: Secondary | ICD-10-CM | POA: Diagnosis not present

## 2016-07-04 HISTORY — DX: Hypothyroidism, unspecified: E03.9

## 2016-07-04 LAB — CBC
HEMATOCRIT: 38.6 % (ref 36.0–46.0)
Hemoglobin: 12.7 g/dL (ref 12.0–15.0)
MCH: 28.3 pg (ref 26.0–34.0)
MCHC: 32.9 g/dL (ref 30.0–36.0)
MCV: 86.2 fL (ref 78.0–100.0)
PLATELETS: 318 10*3/uL (ref 150–400)
RBC: 4.48 MIL/uL (ref 3.87–5.11)
RDW: 13.5 % (ref 11.5–15.5)
WBC: 7.8 10*3/uL (ref 4.0–10.5)

## 2016-07-04 MED ORDER — CHLORHEXIDINE GLUCONATE CLOTH 2 % EX PADS: 6.0000 | MEDICATED_PAD | Freq: Once | CUTANEOUS | Status: DC

## 2016-07-04 MED ORDER — CHLORHEXIDINE GLUCONATE CLOTH 2 % EX PADS
6.0000 | MEDICATED_PAD | Freq: Once | CUTANEOUS | Status: DC
Start: 2016-07-04 — End: 2016-07-05

## 2016-07-04 NOTE — Pre-Procedure Instructions (Signed)
    Kim Webster  07/04/2016      [id number] - [place], [place] - [address] [place] [address] Phone: [phone] Fax: [phone]    Your procedure is scheduled on 07/09/16.  Report to [facility] Admitting at 530 A.M.  Call this number if you have problems the morning of surgery:  [phone]   Remember:  Do not eat food or drink liquids after midnight.  Take these medicines the morning of surgery with A SIP OF WATER --synthroid   Do not wear jewelry, make-up or nail polish.  Do not wear lotions, powders, or perfumes, or deoderant.  Do not shave 48 hours prior to surgery.  Men may shave face and neck.  Do not bring valuables to the hospital.  [facility] is not responsible for any belongings or valuables.  Contacts, dentures or bridgework may not be worn into surgery.  Leave your suitcase in the car.  After surgery it may be brought to your room.  For patients admitted to the hospital, discharge time will be determined by your treatment team.  Patients discharged the day of surgery will not be allowed to drive home.   Name and phone number of your driver:    Special instructions:  Do not take any aspirin,anti-inflammatories,vitamins,or herbal supplements 5-7 days prior to surgery.  Please read over the following fact sheets that you were given.

## 2016-07-08 NOTE — H&P (Signed)
History of Present Illness Kim Kitchen T. Jonasia Coiner MD; 07/03/2016 9:07 AM) The patient is a 60 year old female who presents with breast cancer. She returns to the office due to a new diagnosis of DCIS of the right breast. She has a personal history of right breast lumpectomy and radiation, declined Hormonal therapy, for DCIS of the right breast with surgery date of September 2009. She was seen in August with a negative history and exam. We had been obtaining every other year MRIs due to difficult to image dense breast tissue.  Mammogram obtained April 20, 2016 showed expected postlumpectomy changes without evidence of malignancy.  Subsequent MRI was obtained on April 30, 2016 which revealed an abnormal area of enhancement along the anterior aspect of the lumpectomy site in the upper outer quadrant extending toward the nipple measuring 2.3 cm suspicious for recurrent DCIS. Also noted were multiple small axillary lymph nodes which had increased in size and number but not obviously abnormal.  Targeted ultrasound showed no mass or no abnormal axillary lymph nodes. MRI guided biopsy was performed on May 15, 2016 which has revealed ductal carcinoma in situ, strongly ER PR positive.  She reports no breast symptoms i.e. lump pain, nipple discharge or skin changes.  We had previously discussed that she would require a right total mastectomy with sentinel lymph node biopsy. She was interested in learning more about reconstruction and she was referred. She has decided against reconstruction and is ready to proceed with mastectomy.   Problem List/Past Medical Kim Jolly, MD; 07/03/2016 9:12 AM) HISTORY OF CANCER OF RIGHT BREAST (Z85.3) DUCTAL CARCINOMA IN SITU (DCIS) OF RIGHT BREAST (D05.11) ABNORMAL MAMMOGRAM (R92.8) DENSE BREAST TISSUE (R92.2)  Other Problems Kim Jolly, MD; 07/03/2016 9:12 AM) Breast Cancer Thyroid Disease Migraine Headache Transfusion  history  Past Surgical History Kim Jolly, MD; 07/03/2016 9:12 AM) Breast Biopsy Right. Oral Surgery Breast Mass; Local Excision Right.  Diagnostic Studies History Kim Jolly, MD; 07/03/2016 9:12 AM) Colonoscopy 1-5 years ago Mammogram within last year Pap Smear 1-5 years ago  Allergies Kim Jolly, MD; 07/03/2016 9:12 AM) Hydrocodone-Acetaminophen *ANALGESICS - OPIOID* Nausea, Vomiting. No Known Drug Allergies09/23/2015  Medication History Kim Webster, CMA; 07/03/2016 8:40 AM) Levothyroxine Sodium (75MCG Tablet, Oral daily) Active. Caltrate 600 + D (600-125MG -IU Tablet, Oral) Active. Multivitamin (Oral) Active. Medications Reconciled  Social History Kim Jolly, MD; 07/03/2016 9:12 AM) Caffeine use Coffee, Tea. Alcohol use Occasional alcohol use. No drug use Tobacco use Never smoker.  Family History Kim Jolly, MD; 07/03/2016 9:12 AM) Migraine Headache Mother. Prostate Cancer Father. Thyroid problems Mother. Breast Cancer Mother. Cancer Father. Cerebrovascular Accident Mother. Heart Disease Father. Hypertension Mother, Sister. Alcohol Abuse Father. Arthritis Father, Mother, Sister.  Pregnancy / Birth History Kim Jolly, MD; 07/03/2016 9:12 AM) Age at menarche 89 years. Age of menopause 17-50 Maternal age 67-30 Gravida 3 Regular periods Para 2 Contraceptive History Oral contraceptives.  Vitals Kim Webster CMA; 07/03/2016 8:40 AM) 07/03/2016 8:40 AM Weight: 182 lb Height: 69in Body Surface Area: 1.98 m Body Mass Index: 26.88 kg/m  Temp.: 97.22F  Pulse: 76 (Regular)  BP: 128/88 (Sitting, Left Arm, Standard)       Physical Exam Kim Kitchen T. Hazen Brumett MD; 07/03/2016 9:09 AM) The physical exam findings are as follows: Note:General: Alert, well-developed and well nourished Caucasian female, in no distress Skin: Warm and dry without rash or  infection. HEENT: No palpable masses or thyromegaly. Sclera nonicteric. Pupils equal round and reactive. Lymph nodes: No cervical,  supraclavicular, or inguinal nodes palpable. Breasts: Well-healed lumpectomy site lateral right breast. No palpable masses. No skin changes or nipple crusting or inversion. No palpable axillary adenopathy. Lungs: Breath sounds clear and equal. No wheezing or increased work of breathing. Cardiovascular: Regular rate and rhythm without murmer. No JVD or edema. Peripheral pulses intact. No carotid bruits. Extremities: No edema or joint swelling or deformity. No chronic venous stasis changes. Neurologic: Alert and fully oriented. Gait normal. No focal weakness. Psychiatric: Normal mood and affect. Thought content appropriate with normal judgement and insight    Assessment & Plan Kim Kitchen T. Hurshel Bouillon MD; 07/03/2016 9:12 AM) Kim Webster CARCINOMA IN SITU (DCIS) OF RIGHT BREAST (D05.11) Impression: New diagnosis of ductal carcinoma in situ upper outer right breast. Personal history of right breast lumpectomy and radiation therapy, no hormonal therapy in 2009. Family history of breast cancer in her mother. She does not want reconstruction. We therefore plan to proceed with right total mastectomy with axillary sentinel lymph node biopsy under general anesthesia with overnight hospitalization. We discussed the nature of surgery and expected recovery. We discussed risks of bleeding, infection, anesthetic complications and wound healing problems. All her questions were answered. Current Plans Schedule for Surgery  Right total mastectomy with axillary sentinel lymph node biopsy under general anesthesia with overnight hospitalization.

## 2016-07-09 ENCOUNTER — Encounter (HOSPITAL_COMMUNITY)
Admission: RE | Disposition: A | Discharge status: Home / Self Care | Payer: Self-pay | Source: Ambulatory Visit | Attending: General Surgery

## 2016-07-09 ENCOUNTER — Ambulatory Visit (HOSPITAL_COMMUNITY): Payer: BC Managed Care – PPO | Admitting: Emergency Medicine

## 2016-07-09 ENCOUNTER — Observation Stay (HOSPITAL_COMMUNITY)
Admission: RE | Admit: 2016-07-09 | Discharge: 2016-07-10 | Disposition: A | Discharge status: Home / Self Care | Payer: BC Managed Care – PPO | Source: Ambulatory Visit | Attending: General Surgery | Admitting: General Surgery

## 2016-07-09 ENCOUNTER — Encounter (HOSPITAL_COMMUNITY): Payer: Self-pay | Admitting: Anesthesiology

## 2016-07-09 ENCOUNTER — Ambulatory Visit (HOSPITAL_COMMUNITY): Payer: BC Managed Care – PPO | Admitting: Anesthesiology

## 2016-07-09 ENCOUNTER — Encounter (HOSPITAL_COMMUNITY)
Admission: RE | Admit: 2016-07-09 | Discharge: 2016-07-09 | Disposition: A | Discharge status: Home / Self Care | Payer: BC Managed Care – PPO | Source: Ambulatory Visit | Attending: General Surgery | Admitting: General Surgery

## 2016-07-09 DIAGNOSIS — Z8249 Family history of ischemic heart disease and other diseases of the circulatory system: Secondary | ICD-10-CM | POA: Diagnosis not present

## 2016-07-09 DIAGNOSIS — Z803 Family history of malignant neoplasm of breast: Secondary | ICD-10-CM | POA: Diagnosis not present

## 2016-07-09 DIAGNOSIS — Z79899 Other long term (current) drug therapy: Secondary | ICD-10-CM | POA: Diagnosis not present

## 2016-07-09 DIAGNOSIS — Z885 Allergy status to narcotic agent status: Secondary | ICD-10-CM | POA: Insufficient documentation

## 2016-07-09 DIAGNOSIS — Z170 Estrogen receptor positive status [ER+]: Secondary | ICD-10-CM | POA: Insufficient documentation

## 2016-07-09 DIAGNOSIS — Z8349 Family history of other endocrine, nutritional and metabolic diseases: Secondary | ICD-10-CM | POA: Insufficient documentation

## 2016-07-09 DIAGNOSIS — Z811 Family history of alcohol abuse and dependence: Secondary | ICD-10-CM | POA: Diagnosis not present

## 2016-07-09 DIAGNOSIS — E039 Hypothyroidism, unspecified: Secondary | ICD-10-CM | POA: Insufficient documentation

## 2016-07-09 DIAGNOSIS — Z9889 Other specified postprocedural states: Secondary | ICD-10-CM | POA: Diagnosis not present

## 2016-07-09 DIAGNOSIS — Z8261 Family history of arthritis: Secondary | ICD-10-CM | POA: Insufficient documentation

## 2016-07-09 DIAGNOSIS — Z823 Family history of stroke: Secondary | ICD-10-CM | POA: Diagnosis not present

## 2016-07-09 DIAGNOSIS — D0511 Intraductal carcinoma in situ of right breast: Secondary | ICD-10-CM | POA: Diagnosis not present

## 2016-07-09 DIAGNOSIS — Z8042 Family history of malignant neoplasm of prostate: Secondary | ICD-10-CM | POA: Diagnosis not present

## 2016-07-09 HISTORY — DX: Malignant neoplasm of unspecified site of right female breast: C50.911

## 2016-07-09 HISTORY — DX: Migraine, unspecified, not intractable, without status migrainosus: G43.909

## 2016-07-09 HISTORY — PX: MASTECTOMY COMPLETE / SIMPLE W/ SENTINEL NODE BIOPSY: SUR846

## 2016-07-09 HISTORY — DX: Unspecified osteoarthritis, unspecified site: M19.90

## 2016-07-09 HISTORY — PX: MASTECTOMY W/ SENTINEL NODE BIOPSY: SHX2001

## 2016-07-09 HISTORY — DX: Pneumonia, unspecified organism: J18.9

## 2016-07-09 SURGERY — MASTECTOMY WITH SENTINEL LYMPH NODE BIOPSY
Anesthesia: Regional | Site: Breast | Laterality: Right

## 2016-07-09 MED ORDER — SCOPOLAMINE 1 MG/3DAYS TD PT72: MEDICATED_PATCH | TRANSDERMAL | Status: DC | PRN

## 2016-07-09 MED ORDER — ONDANSETRON HCL 4 MG/2ML IJ SOLN
INTRAMUSCULAR | Status: AC
  Filled 2016-07-09: qty 2

## 2016-07-09 MED ORDER — EPHEDRINE 5 MG/ML INJ
INTRAVENOUS | Status: AC
  Filled 2016-07-09: qty 10

## 2016-07-09 MED ORDER — MORPHINE SULFATE (PF) 2 MG/ML IV SOLN: 2.0000 mg | INTRAVENOUS | Status: DC | PRN

## 2016-07-09 MED ORDER — FENTANYL CITRATE (PF) 100 MCG/2ML IJ SOLN
INTRAMUSCULAR | Status: AC
  Filled 2016-07-09: qty 2

## 2016-07-09 MED ORDER — LIDOCAINE HCL (CARDIAC) 20 MG/ML IV SOLN
INTRAVENOUS | Status: DC | PRN
  Administered 2016-07-09: 50 mg via INTRAVENOUS

## 2016-07-09 MED ORDER — PROPOFOL 10 MG/ML IV BOLUS
INTRAVENOUS | Status: DC | PRN
  Administered 2016-07-09: 170 mg via INTRAVENOUS

## 2016-07-09 MED ORDER — ONDANSETRON HCL 4 MG/2ML IJ SOLN
INTRAMUSCULAR | Status: DC | PRN
  Administered 2016-07-09: 4 mg via INTRAVENOUS

## 2016-07-09 MED ORDER — SCOPOLAMINE 1 MG/3DAYS TD PT72
MEDICATED_PATCH | TRANSDERMAL | Status: DC | PRN
  Administered 2016-07-09: 1 via TRANSDERMAL

## 2016-07-09 MED ORDER — PROPOFOL 10 MG/ML IV BOLUS
INTRAVENOUS | Status: AC
  Filled 2016-07-09: qty 20

## 2016-07-09 MED ORDER — SCOPOLAMINE 1 MG/3DAYS TD PT72
MEDICATED_PATCH | TRANSDERMAL | Status: AC
  Filled 2016-07-09: qty 1

## 2016-07-09 MED ORDER — CELECOXIB 200 MG PO CAPS
400.0000 mg | ORAL_CAPSULE | ORAL | Status: AC
  Administered 2016-07-09: 400 mg via ORAL
  Filled 2016-07-09: qty 2

## 2016-07-09 MED ORDER — DEXTROSE IN LACTATED RINGERS 5 % IV SOLN
INTRAVENOUS | Status: DC
  Administered 2016-07-09: 13:00:00 via INTRAVENOUS

## 2016-07-09 MED ORDER — METHYLENE BLUE 0.5 % INJ SOLN
INTRAVENOUS | Status: AC
  Filled 2016-07-09: qty 10

## 2016-07-09 MED ORDER — ONDANSETRON 4 MG PO TBDP: 4.0000 mg | ORAL_TABLET | Freq: Four times a day (QID) | ORAL | Status: DC | PRN

## 2016-07-09 MED ORDER — ONDANSETRON HCL 4 MG/2ML IJ SOLN: 4.0000 mg | Freq: Four times a day (QID) | INTRAMUSCULAR | Status: DC | PRN

## 2016-07-09 MED ORDER — OXYCODONE-ACETAMINOPHEN 5-325 MG PO TABS
ORAL_TABLET | ORAL | Status: AC
  Filled 2016-07-09: qty 2

## 2016-07-09 MED ORDER — FENTANYL CITRATE (PF) 100 MCG/2ML IJ SOLN
25.0000 ug | INTRAMUSCULAR | Status: DC | PRN
  Administered 2016-07-09: 50 ug via INTRAVENOUS

## 2016-07-09 MED ORDER — ONDANSETRON HCL 4 MG/2ML IJ SOLN: 4.0000 mg | Freq: Once | INTRAMUSCULAR | Status: DC | PRN

## 2016-07-09 MED ORDER — FENTANYL CITRATE (PF) 100 MCG/2ML IJ SOLN
INTRAMUSCULAR | Status: AC
  Filled 2016-07-09: qty 4

## 2016-07-09 MED ORDER — ENOXAPARIN SODIUM 40 MG/0.4ML ~~LOC~~ SOLN
40.0000 mg | SUBCUTANEOUS | Status: DC
  Filled 2016-07-09: qty 0.4

## 2016-07-09 MED ORDER — GABAPENTIN 300 MG PO CAPS
300.0000 mg | ORAL_CAPSULE | ORAL | Status: AC
  Administered 2016-07-09: 300 mg via ORAL
  Filled 2016-07-09: qty 1

## 2016-07-09 MED ORDER — MIDAZOLAM HCL 5 MG/5ML IJ SOLN
INTRAMUSCULAR | Status: DC | PRN
  Administered 2016-07-09: 2 mg via INTRAVENOUS

## 2016-07-09 MED ORDER — 0.9 % SODIUM CHLORIDE (POUR BTL) OPTIME
TOPICAL | Status: DC | PRN
  Administered 2016-07-09: 1000 mL

## 2016-07-09 MED ORDER — LACTATED RINGERS IV SOLN
INTRAVENOUS | Status: DC | PRN
  Administered 2016-07-09 (×2): via INTRAVENOUS

## 2016-07-09 MED ORDER — OXYCODONE-ACETAMINOPHEN 5-325 MG PO TABS
1.0000 | ORAL_TABLET | ORAL | Status: DC | PRN
  Administered 2016-07-09: 2 via ORAL
  Administered 2016-07-09 – 2016-07-10 (×2): 1 via ORAL
  Administered 2016-07-10: 2 via ORAL
  Filled 2016-07-09: qty 2
  Filled 2016-07-09: qty 1
  Filled 2016-07-09: qty 2
  Filled 2016-07-09: qty 1

## 2016-07-09 MED ORDER — LIDOCAINE 2% (20 MG/ML) 5 ML SYRINGE
INTRAMUSCULAR | Status: AC
Start: 2016-07-09 — End: 2016-07-09
  Filled 2016-07-09: qty 5

## 2016-07-09 MED ORDER — TECHNETIUM TC 99M SULFUR COLLOID FILTERED
1.0000 | Freq: Once | INTRAVENOUS | Status: AC | PRN
Start: 2016-07-09 — End: 2016-07-09
  Administered 2016-07-09: 1 via INTRADERMAL

## 2016-07-09 MED ORDER — MIDAZOLAM HCL 2 MG/2ML IJ SOLN
INTRAMUSCULAR | Status: AC
  Filled 2016-07-09: qty 2

## 2016-07-09 MED ORDER — FENTANYL CITRATE (PF) 100 MCG/2ML IJ SOLN
INTRAMUSCULAR | Status: DC | PRN
  Administered 2016-07-09 (×8): 50 ug via INTRAVENOUS

## 2016-07-09 MED ORDER — DEXAMETHASONE SODIUM PHOSPHATE 10 MG/ML IJ SOLN
INTRAMUSCULAR | Status: AC
Start: 2016-07-09 — End: 2016-07-09
  Filled 2016-07-09: qty 1

## 2016-07-09 MED ORDER — EPHEDRINE SULFATE 50 MG/ML IJ SOLN
INTRAMUSCULAR | Status: DC | PRN
  Administered 2016-07-09: 5 mg via INTRAVENOUS

## 2016-07-09 MED ORDER — LEVOTHYROXINE SODIUM 88 MCG PO TABS
88.0000 ug | ORAL_TABLET | Freq: Every day | ORAL | Status: DC
  Administered 2016-07-10: 88 ug via ORAL
  Filled 2016-07-09: qty 1

## 2016-07-09 MED ORDER — CEFAZOLIN SODIUM-DEXTROSE 2-4 GM/100ML-% IV SOLN
2.0000 g | INTRAVENOUS | Status: AC
  Administered 2016-07-09: 2 g via INTRAVENOUS
  Filled 2016-07-09: qty 100

## 2016-07-09 MED ORDER — DEXAMETHASONE SODIUM PHOSPHATE 10 MG/ML IJ SOLN
INTRAMUSCULAR | Status: DC | PRN
Start: 2016-07-09 — End: 2016-07-09
  Administered 2016-07-09: 10 mg via INTRAVENOUS

## 2016-07-09 SURGICAL SUPPLY — 51 items
BINDER BREAST LRG (GAUZE/BANDAGES/DRESSINGS) ×3 IMPLANT
BIOPATCH RED 1 DISK 7.0 (GAUZE/BANDAGES/DRESSINGS) ×2 IMPLANT
BIOPATCH RED 1IN DISK 7.0MM (GAUZE/BANDAGES/DRESSINGS) ×1
CANISTER SUCTION 2500CC (MISCELLANEOUS) ×3 IMPLANT
CHLORAPREP W/TINT 26ML (MISCELLANEOUS) ×3 IMPLANT
CLIP TI MEDIUM 6 (CLIP) ×3 IMPLANT
CONT SPEC 4OZ CLIKSEAL STRL BL (MISCELLANEOUS) ×6 IMPLANT
COVER PROBE W GEL 5X96 (DRAPES) ×3 IMPLANT
COVER SURGICAL LIGHT HANDLE (MISCELLANEOUS) ×3 IMPLANT
DERMABOND ADVANCED (GAUZE/BANDAGES/DRESSINGS) ×2
DERMABOND ADVANCED .7 DNX12 (GAUZE/BANDAGES/DRESSINGS) ×1 IMPLANT
DEVICE DISSECT PLASMABLAD 3.0S (MISCELLANEOUS) ×1 IMPLANT
DRAIN CHANNEL 19F RND (DRAIN) ×3 IMPLANT
DRAPE CHEST BREAST 15X10 FENES (DRAPES) ×3 IMPLANT
DRAPE SURG 17X23 STRL (DRAPES) ×12 IMPLANT
DRSG PAD ABDOMINAL 8X10 ST (GAUZE/BANDAGES/DRESSINGS) ×6 IMPLANT
DRSG TEGADERM 2-3/8X2-3/4 SM (GAUZE/BANDAGES/DRESSINGS) ×3 IMPLANT
ELECT CAUTERY BLADE 6.4 (BLADE) ×3 IMPLANT
ELECT REM PT RETURN 9FT ADLT (ELECTROSURGICAL) ×6
ELECTRODE REM PT RTRN 9FT ADLT (ELECTROSURGICAL) ×2 IMPLANT
EVACUATOR SILICONE 100CC (DRAIN) ×3 IMPLANT
GLOVE BIO SURGEON STRL SZ7 (GLOVE) ×3 IMPLANT
GLOVE BIOGEL PI IND STRL 7.0 (GLOVE) ×1 IMPLANT
GLOVE BIOGEL PI IND STRL 8 (GLOVE) ×1 IMPLANT
GLOVE BIOGEL PI INDICATOR 7.0 (GLOVE) ×2
GLOVE BIOGEL PI INDICATOR 8 (GLOVE) ×2
GLOVE ECLIPSE 7.5 STRL STRAW (GLOVE) ×3 IMPLANT
GOWN STRL REUS W/ TWL LRG LVL3 (GOWN DISPOSABLE) ×3 IMPLANT
GOWN STRL REUS W/ TWL XL LVL3 (GOWN DISPOSABLE) ×1 IMPLANT
GOWN STRL REUS W/TWL LRG LVL3 (GOWN DISPOSABLE) ×6
GOWN STRL REUS W/TWL XL LVL3 (GOWN DISPOSABLE) ×2
KIT BASIN OR (CUSTOM PROCEDURE TRAY) ×3 IMPLANT
KIT ROOM TURNOVER OR (KITS) ×3 IMPLANT
NEEDLE 18GX1X1/2 (RX/OR ONLY) (NEEDLE) IMPLANT
NEEDLE BLUNT 16X1.5 OR ONLY (NEEDLE) IMPLANT
NEEDLE HYPO 25GX1X1/2 BEV (NEEDLE) IMPLANT
NS IRRIG 1000ML POUR BTL (IV SOLUTION) ×3 IMPLANT
PACK GENERAL/GYN (CUSTOM PROCEDURE TRAY) ×3 IMPLANT
PAD ARMBOARD 7.5X6 YLW CONV (MISCELLANEOUS) ×3 IMPLANT
PLASMABLADE 3.0S (MISCELLANEOUS) ×3
SPECIMEN JAR X LARGE (MISCELLANEOUS) ×3 IMPLANT
SUT ETHILON 2 0 FS 18 (SUTURE) ×3 IMPLANT
SUT MNCRL AB 4-0 PS2 18 (SUTURE) ×3 IMPLANT
SUT VIC AB 3-0 54X BRD REEL (SUTURE) ×1 IMPLANT
SUT VIC AB 3-0 BRD 54 (SUTURE) ×2
SUT VIC AB 3-0 SH 18 (SUTURE) ×6 IMPLANT
SYR CONTROL 10ML LL (SYRINGE) IMPLANT
TAPE STRIPS DRAPE STRL (GAUZE/BANDAGES/DRESSINGS) ×3 IMPLANT
TOWEL OR 17X26 10 PK STRL BLUE (TOWEL DISPOSABLE) ×3 IMPLANT
TUBE CONNECTING 12'X1/4 (SUCTIONS) ×1
TUBE CONNECTING 12X1/4 (SUCTIONS) ×2 IMPLANT

## 2016-07-09 NOTE — Anesthesia Preprocedure Evaluation (Addendum)
Anesthesia Evaluation  Patient identified by MRN, date of birth, ID band Patient awake    Reviewed: Allergy & Precautions, NPO status , Patient's Chart, lab work & pertinent test results  Airway Mallampati: II  TM Distance: >3 FB Neck ROM: Full    Dental  (+) Teeth Intact, Dental Advisory Given   Pulmonary    breath sounds clear to auscultation       Cardiovascular negative cardio ROS   Rhythm:Regular Rate:Normal     Neuro/Psych    GI/Hepatic negative GI ROS, Neg liver ROS,   Endo/Other  Hypothyroidism   Renal/GU negative Renal ROS     Musculoskeletal   Abdominal   Peds  Hematology   Anesthesia Other Findings   Reproductive/Obstetrics                           Anesthesia Physical Anesthesia Plan  ASA: II  Anesthesia Plan: General and Regional   Post-op Pain Management:    Induction: Intravenous  Airway Management Planned: LMA  Additional Equipment:   Intra-op Plan:   Post-operative Plan:   Informed Consent: I have reviewed the patients History and Physical, chart, labs and discussed the procedure including the risks, benefits and alternatives for the proposed anesthesia with the patient or authorized representative who has indicated his/her understanding and acceptance.   Dental advisory given  Plan Discussed with: CRNA and Anesthesiologist  Anesthesia Plan Comments:        Anesthesia Quick Evaluation

## 2016-07-09 NOTE — Op Note (Signed)
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 She now presents with MRI showing a several centimeter area of enhancement anterior to her previous lumpectomy site and biopsy has revealed DCIS. ER/PR positive. After extensive preoperative discussion and workup detailed elsewhere we have elected to proceed with right total mastectomy and axillary sentinel lymph node biopsy as surgical therapy.    Procedure Detail:  In the holding area the patient underwent a pectoral block and underwent injection of 1 mCi of technetium sulfur colloid intradermally around the right nipple. She was taken to the operating room, placed supine position on the operating table and laryngeal mask general anesthesia induced. She was carefully positioned with her arm extended. An area of high counts in the right axilla was confirmed with the neoprobe. The entire right anterior chest and upper arm were widely sterilely prepped and draped. She received preoperative IV antibiotics. PAS were in place. Patient timeout was performed and correct procedure verified. An elliptical transversely oriented incision was used encompassing the nipple areola complex. Using the plasma blade skin and subcutaneous flaps were developed superiorly toward the clavicle, medially to the edge of the sternum, inferiorly to the inframammary crease and laterally out to the anterior border of the latissimus dorsi. Dissection was deepened down to the chest [place]. The breast tissue was then reflected up off the chest Figg working medial to lateral dissecting away from the  pectoralis and serratus. As the dissection progressed laterally the anterior border of the latissimus was dissected and the dissection carried up toward the axilla. The neoprobe was then used to localize to hot lymph nodes in the right axilla which were mildly enlarged. Each of these was completely excised with the plasma blade and background counts in the axilla at that point were minimal and there was no palpable adenopathy. I came through the low axilla with Kelly clamps and the specimen was removed and these were tied with 3-0 Vicryl. The specimen was oriented and sent for permanent pathology. The wound was thoroughly irrigated and complete hemostasis obtained. A 19 Blake drain was left through separate stab wound and placed under both flaps. The subcutaneous tissue was closed with interrupted 3-0 Vicryl and the skin with running subcuticular 4-0 Monocryl and Dermabond. ABDs and breast binder were placed. Sponge needle and isthmic counts were correct.    Findings: As above  Estimated Blood Loss:  less than 50 mL         Drains: 19 Blake drain  Blood Given: none          Specimens: #1 right axillary sentinel lymph nodes  X 2   #2 right total mastectomy        Complications:  * No complications entered in OR log *         Disposition: PACU - hemodynamically stable.         Condition: stable

## 2016-07-09 NOTE — Anesthesia Procedure Notes (Addendum)
Anesthesia Regional Block:  Pectoralis block  Pre-Anesthetic Checklist: ,, timeout performed, Correct Patient, Correct Site, Correct Laterality, Correct Procedure, Correct Position, site marked, Risks and benefits discussed,  Surgical consent,  Pre-op evaluation,  At surgeon's request and post-op pain management  Laterality: Right  Prep: chloraprep       Needles:  Injection technique: Single-shot  Needle Type: Stimulator Needle - 80          Additional Needles:  Procedures: ultrasound guided (picture in chart) Pectoralis block Narrative:  Start time: 07/09/2016 7:10 AM End time: 07/09/2016 7:15 AM Injection made incrementally with aspirations every 5 mL.  Performed by: Personally   Additional Notes: 15 cc 0.75% Naropin injected easily

## 2016-07-09 NOTE — Anesthesia Procedure Notes (Signed)
Procedure Name: LMA Insertion Date/Time: 07/09/2016 7:46 AM Performed by: Luciana Axe K Pre-anesthesia Checklist: Patient identified, Emergency Drugs available, Suction available and Patient being monitored Patient Re-evaluated:Patient Re-evaluated prior to inductionOxygen Delivery Method: Circle System Utilized Preoxygenation: Pre-oxygenation with 100% oxygen Intubation Type: IV induction Ventilation: Mask ventilation without difficulty LMA: LMA inserted LMA Size: 4.0 Number of attempts: 1 Airway Equipment and Method: Bite block Placement Confirmation: positive ETCO2 and breath sounds checked- equal and bilateral Tube secured with: Tape Dental Injury: Teeth and Oropharynx as per pre-operative assessment

## 2016-07-09 NOTE — Transfer of Care (Signed)
Immediate Anesthesia Transfer of Care Note  Patient: Kim Webster  Procedure(s) Performed: Procedure(s): RIGHT BREAST MASTECTOMY WITH RIGHT SENTINEL LYMPH NODE BIOPSY (Right)  Patient Location: PACU  Anesthesia Type:General  Level of Consciousness: awake, oriented and patient cooperative  Airway & Oxygen Therapy: Patient Spontanous Breathing and Patient connected to nasal cannula oxygen  Post-op Assessment: Report given to RN and Post -op Vital signs reviewed and stable  Post vital signs: Reviewed  Last Vitals:  Vitals:   07/09/16 0636 07/09/16 0931  BP: (!) 145/81   Pulse: 71   Resp: 18   Temp: 36.7 C 36.5 C    Last Pain:  Vitals:   07/09/16 0708  TempSrc:   PainSc: 0-No pain         Complications: No apparent anesthesia complications

## 2016-07-09 NOTE — Interval H&P Note (Signed)
History and Physical Interval Note:  07/09/2016 7:06 AM  Kim Webster  has presented today for surgery, with the diagnosis of DCIS RIGHT BREAST  The various methods of treatment have been discussed with the patient and family. After consideration of risks, benefits and other options for treatment, the patient has consented to  Procedure(s): RIGHT BREAST MASTECTOMY WITH RIGHT SENTINEL LYMPH NODE BIOPSY (Right) as a surgical intervention .  The patient's history has been reviewed, patient examined, no change in status, stable for surgery.  I have reviewed the patient's chart and labs.  Questions were answered to the patient's satisfaction.     [name] T

## 2016-07-09 NOTE — Anesthesia Postprocedure Evaluation (Signed)
Anesthesia Post Note  Patient: Kim Webster  Procedure(s) Performed: Procedure(s) (LRB): RIGHT BREAST MASTECTOMY WITH RIGHT SENTINEL LYMPH NODE BIOPSY (Right)  Patient location during evaluation: PACU Anesthesia Type: General and Regional Level of consciousness: awake, awake and alert and oriented Pain management: pain level controlled Vital Signs Assessment: post-procedure vital signs reviewed and stable Respiratory status: spontaneous breathing, nonlabored ventilation and respiratory function stable Cardiovascular status: blood pressure returned to baseline Postop Assessment: no headache    Last Vitals:  Vitals:   07/09/16 1032 07/09/16 1200  BP: 120/65   Pulse: (!) 48 65  Resp: 16   Temp: 36.3 C     Last Pain:  Vitals:   07/09/16 1032  TempSrc: Oral  PainSc:                  Kim Webster

## 2016-07-09 NOTE — Progress Notes (Signed)
1030 Received pt from PACU, A&O x3. Denies pain at this time. Right chest dressing dry and intact, w/ binder on. JP drain to right chest, intact.

## 2016-07-10 ENCOUNTER — Encounter (HOSPITAL_COMMUNITY): Payer: Self-pay | Admitting: General Surgery

## 2016-07-10 DIAGNOSIS — D0511 Intraductal carcinoma in situ of right breast: Secondary | ICD-10-CM | POA: Diagnosis not present

## 2016-07-10 LAB — CBC
HEMATOCRIT: 32.3 % — AB (ref 36.0–46.0)
HEMOGLOBIN: 10.7 g/dL — AB (ref 12.0–15.0)
MCH: 28.8 pg (ref 26.0–34.0)
MCHC: 33.1 g/dL (ref 30.0–36.0)
MCV: 86.8 fL (ref 78.0–100.0)
Platelets: 272 10*3/uL (ref 150–400)
RBC: 3.72 MIL/uL — ABNORMAL LOW (ref 3.87–5.11)
RDW: 14.1 % (ref 11.5–15.5)
WBC: 9.7 10*3/uL (ref 4.0–10.5)

## 2016-07-10 MED ORDER — OXYCODONE-ACETAMINOPHEN 5-325 MG PO TABS: 1.0000 | ORAL_TABLET | ORAL | 0 refills | Status: DC | PRN

## 2016-07-10 NOTE — Progress Notes (Signed)
Pt was ambulating in the hall this morning. Right chest incision with skin glue, dressing dry and intact. Pin is controlled with oral pain meds. Discharge instructions given to pt, verbalized understanding. Discharged to home accompanied by spouse.

## 2016-07-10 NOTE — Discharge Summary (Signed)
  Patient ID: Kim Webster [id number] 60 y.o. 08-26-1955  07/09/2016  Discharge date and time: 07/10/2016   Admitting Physician: Excell Seltzer T  Discharge Physician: Excell Seltzer T  Admission Diagnoses: DCIS RIGHT BREAST  Discharge Diagnoses: Same  Operations: Procedure(s): RIGHT BREAST MASTECTOMY WITH RIGHT SENTINEL LYMPH NODE BIOPSY  Admission Condition: good  Discharged Condition: good  Indication for Admission: Patient with previous history of DCIS right breast with lumpectomy and radiation 10 years ago presents with a new diagnosis of DCIS right breast. After preoperative workup and discussion detailed elsewhere she is electively admitted overnight following right total mastectomy  Hospital Course: Patient underwent an uneventful right total mastectomy with axillary sentinel lymph node biopsy. She was observed overnight and had no complications. The following morning incision is clean without swelling, vital signs stable, mild pain, and she is felt ready for discharge.  Disposition: Home  Patient Instructions:    Medication List    TAKE these medications   CALTRATE 600+D PO Take 1 tablet by mouth daily at 12 noon.   CENTRUM SILVER PO Take 1 tablet by mouth daily at 12 noon.   levothyroxine 88 MCG tablet Commonly known as:  SYNTHROID, LEVOTHROID Take 88 mcg by mouth daily before breakfast.   mometasone 0.1 % lotion Commonly known as:  ELOCON APPLY TO AFFECTED AREA TWICE A DAY AS NEEDED FOR SORES ON SCALP   MOTRIN IB 200 MG tablet Generic drug:  ibuprofen Take 200 mg by mouth every 8 (eight) hours as needed (for pain.).   oxyCODONE-acetaminophen 5-325 MG tablet Commonly known as:  PERCOCET/ROXICET Take 1-2 tablets by mouth every 4 (four) hours as needed for moderate pain.   VIACTIV [id number] MG-UNT-MCG Chew Generic drug:  Calcium-Vitamin D-Vitamin K Chew 1 tablet by mouth daily at 12 noon.       Activity: activity as tolerated Diet:  regular diet Wound Care: Empty drain daily and record amount  Follow-up:  With Dr. Excell Seltzer in 1 week.  Signed: [name] MD, FACS  07/10/2016, 6:42 AM

## 2016-07-10 NOTE — Discharge Instructions (Signed)
[facility] °[phone] ° °MASTECTOMY: POST OP INSTRUCTIONS ° °Always review your discharge instruction sheet given to you by the facility where your surgery was performed. °IF YOU HAVE DISABILITY OR FAMILY LEAVE FORMS, YOU MUST BRING THEM TO THE OFFICE FOR PROCESSING.   °DO NOT GIVE THEM TO YOUR DOCTOR. °A prescription for pain medication may be given to you upon discharge.  Take your pain medication as prescribed, if needed.  If narcotic pain medicine is not needed, then you may take acetaminophen (Tylenol) or ibuprofen (Advil) as needed. °1. Take your usually prescribed medications unless otherwise directed. °2. If you need a refill on your pain medication, please contact your pharmacy.  They will contact our office to request authorization.  Prescriptions will not be filled after 5pm or on week-ends. °3. You should follow a light diet the first few days after arrival home, such as soup and crackers, etc.  Resume your normal diet the day after surgery. °4. Most patients will experience some swelling and bruising on the chest and underarm.  Ice packs will help.  Swelling and bruising can take several days to resolve.  °5. It is common to experience some constipation if taking pain medication after surgery.  Increasing fluid intake and taking a stool softener (such as Colace) will usually help or prevent this problem from occurring.  A mild laxative (Milk of Magnesia or Miralax) should be taken according to package instructions if there are no bowel movements after 48 hours. °6. Unless discharge instructions indicate otherwise, leave your bandage dry and in place until your next appointment in 3-5 days.  You may take a limited sponge bath.  No tube baths or showers until the drains are removed.  You may have steri-strips (small skin tapes) in place directly over the incision.  These strips should be left on the skin for 7-10 days.  If your surgeon used skin glue on the incision, you may  shower in 24 hours.  The glue will flake off over the next 2-3 weeks.  Any sutures or staples will be removed at the office during your follow-up visit. °7. DRAINS:  If you have drains in place, it is important to keep a list of the amount of drainage produced each day in your drains.  Before leaving the hospital, you should be instructed on drain care.  Call our office if you have any questions about your drains. °8. ACTIVITIES:  You may resume regular (light) daily activities beginning the next day--such as daily self-care, walking, climbing stairs--gradually increasing activities as tolerated.  You may have sexual intercourse when it is comfortable.  Refrain from any heavy lifting or straining until approved by your doctor. °a. You may drive when you are no longer taking prescription pain medication, you can comfortably wear a seatbelt, and you can safely maneuver your car and apply brakes. °b. RETURN TO WORK:  __________________________________________________________ °9. You should see your doctor in the office for a follow-up appointment approximately 3-5 days after your surgery.  Your doctor’s nurse will typically make your follow-up appointment when she calls you with your pathology report.  Expect your pathology report 2-3 business days after your surgery.  You may call to check if you do not hear from us after three days.   °10. OTHER INSTRUCTIONS: ______________________________________________________________________________________________ ____________________________________________________________________________________________ °WHEN TO CALL YOUR DOCTOR: °1. Fever over 101.0 °2. Nausea and/or vomiting °3. Extreme swelling or bruising °4. Continued bleeding from incision. °5. Increased pain, redness, or drainage from the incision. °  The clinic staff is available to answer your questions during regular business hours.  Please don’t hesitate to call and ask to speak to one of the nurses for clinical  concerns.  If you have a medical emergency, go to the nearest emergency room or call 911.  A surgeon from [facility] is always on call at the hospital. °[address], [address], [place], [place]  [address] ? [address] [address], [place], [place]   [address] °[phone] ? [phone] ? FAX [phone] °Web site: [email or web address] °

## 2016-07-10 NOTE — Progress Notes (Signed)
1 Day Post-Op  Subjective: No complaints, just sore.  Objective: Vital signs in last 24 hours: Temp:  [97.4 F (36.3 C)-99 F (37.2 C)] 98.4 F (36.9 C) (11/28 0533) Pulse Rate:  [58-85] 73 (11/28 0533) Resp:  [13-18] 17 (11/28 0533) BP: (92-130)/(46-75) 93/46 (11/28 0533) SpO2:  [95 %-100 %] 98 % (11/28 0533) Weight:  [85.1 kg (187 lb 9.8 oz)] 85.1 kg (187 lb 9.8 oz) (11/27 1032)    Intake/Output from previous day: 11/27 0701 - 11/28 0700 In: 2429.2 [P.O.:365; I.V.:2064.2] Out: 610 [Urine:500; Drains:60; Blood:50] Intake/Output this shift: Total I/O In: 864.2 [I.V.:864.2] Out: 40 [Drains:40]  General appearance: alert, cooperative and no distress Incision/Wound: Incision clean and dry, flaps healthy. No hematoma. JP drainage serosanguineous.  Lab Results:   Recent Labs  07/10/16 0352  WBC 9.7  HGB 10.7*  HCT 32.3*  PLT 272   BMET No results for input(s): NA, K, CL, CO2, GLUCOSE, BUN, CREATININE, CALCIUM in the last 72 hours.   Studies/Results: No results found.  Anti-infectives: Anti-infectives    Start     Dose/Rate Route Frequency Ordered Stop   07/09/16 0521  ceFAZolin (ANCEF) IVPB 2g/100 mL premix     2 g 200 mL/hr over 30 Minutes Intravenous On call to O.R. 07/09/16 0521 07/09/16 0750      Assessment/Plan: s/p Procedure(s): RIGHT BREAST MASTECTOMY WITH RIGHT SENTINEL LYMPH NODE BIOPSY Doing well without apparent complication. Okay for discharge.   LOS: 0 days    Darline Faith T 11/28/2017Patient ID: Kim Webster, female   DOB: [birth year], 58 y.o.   MRN: [id number]

## 2016-07-13 NOTE — Addendum Note (Signed)
Addendum  created 07/13/16 2152 by Roberts Gaudy, MD   Anesthesia Intra Blocks edited, Sign clinical note

## 2016-07-17 ENCOUNTER — Ambulatory Visit: Payer: BC Managed Care – PPO | Admitting: Physical Therapy

## 2016-07-20 ENCOUNTER — Ambulatory Visit: Payer: BC Managed Care – PPO | Admitting: Physical Therapy

## 2016-07-26 ENCOUNTER — Ambulatory Visit: Payer: BC Managed Care – PPO | Attending: General Surgery | Admitting: Physical Therapy

## 2016-07-26 DIAGNOSIS — Z483 Aftercare following surgery for neoplasm: Secondary | ICD-10-CM | POA: Diagnosis present

## 2016-07-26 DIAGNOSIS — M25611 Stiffness of right shoulder, not elsewhere classified: Secondary | ICD-10-CM | POA: Insufficient documentation

## 2016-07-26 NOTE — Patient Instructions (Signed)
Cane Overhead - Supine  Hold cane at thighs with both hands, extend arms straight over head. Hold 2___ seconds. Repeat 5x 2 sets ___ times. Do _3__ times per day.

## 2016-07-26 NOTE — Therapy (Signed)
[place], [place], [address] Phone: [phone]   Fax:  [phone]  Physical Therapy Evaluation  Patient Details  Name: Kim Webster MRN: [id number] Date of Birth: 12-Sep-1955 Referring Provider: Dr. Excell Seltzer   Encounter Date: 07/26/2016      PT End of Session - 07/26/16 1147    Visit Number 1   Number of Visits 5   Date for PT Re-Evaluation 08/31/16   PT Start Time [id number]   PT Stop Time 1100   PT Time Calculation (min) 45 min   Activity Tolerance Patient tolerated treatment well   Behavior During Therapy [facility] for tasks assessed/performed      Past Medical History:  Diagnosis Date  . Arthritis    "mild; thumb joints" (07/09/2016)  . Blood transfusion 1986   "related to ruptured ectopic pregnancy"  . Breast cancer, right breast ([place]) 2009; 06/2016  . Ectopic pregnancy 1986   ruptured  . Family history of breast cancer   . Family history of colon cancer   . Family history of pancreatic cancer   . Family history of prostate cancer   . Hypothyroidism   . Migraine    "~ twice/year" (07/09/2016)  . Pneumonia ~ 2007   "walking pneumonia"  . Thyroid disease    hypothyroism    Past Surgical History:  Procedure Laterality Date  . BREAST BIOPSY Right 2009; 05/2016  . BREAST LUMPECTOMY Right 2009  . ECTOPIC PREGNANCY SURGERY  1986  . MASTECTOMY COMPLETE / SIMPLE W/ SENTINEL NODE BIOPSY Right 07/09/2016  . MASTECTOMY W/ SENTINEL NODE BIOPSY Right 07/09/2016   Procedure: RIGHT BREAST MASTECTOMY WITH RIGHT SENTINEL LYMPH NODE BIOPSY;  Surgeon: Excell Seltzer, MD;  Location: [place];  Service: General;  Laterality: Right;    There were no vitals filed for this visit.       Subjective Assessment - 07/26/16 1029    Subjective "doing pretty good" notices some tightness in armpit and pins and needed across chest.  Wants to come to ABC class    Pertinent History right mastetomy on NOv. 27.  2017 with 2 lymph nodes, will not have chemotherapy, had radiation in 2009, with not problem. will go to see Dr. Burr Medico about hormone therapy    Patient Stated Goals wants to learn exercises and about lymphedema    Currently in Pain? No/denies  occasional disomfort in axilla             [facility] PT Assessment - 07/26/16 0001      Assessment   Medical Diagnosis right breast cancer    Referring Provider Dr. Excell Seltzer    Onset Date/Surgical Date 07/09/16   Hand Dominance Right     Precautions   Precautions Other (comment)   Precaution Comments no to lift heavy things      Restrictions   Weight Bearing Restrictions No     Balance Screen   Has the patient fallen in the past 6 months No   Has the patient had a decrease in activity level because of a fear of falling?  No   Is the patient reluctant to leave their home because of a fear of falling?  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 She has some fullness in right axilla above top of bandeau.  Pt advised to keep bandeau pulled up into axilla and use straps to keep it up if necessary. she alos has some fullness at medial incision.  Diffuse dry skin noted    Skin Integrity healing incision, no open area    Quick DASH  36.36     Sensation   Additional Comments Pt report some numbness in  upper arm and across chest      Coordination   Gross Motor Movements are Fluid and Coordinated Yes     Posture/Postural Control   Posture/Postural Control Postural limitations   Postural Limitations Forward head     AROM   Right Shoulder Flexion 135 Degrees   Right Shoulder ABduction 135 Degrees   Right Shoulder External Rotation 70 Degrees   Left Shoulder Flexion 153 Degrees   Left Shoulder ABduction 163 Degrees   Left Shoulder External Rotation 75 Degrees     Strength   Right Shoulder Flexion 3-/5  due to post op pain    Right Shoulder ABduction 3-/5  due to post op pain   Left Shoulder Flexion 4/5   Left Shoulder ABduction 4/5           LYMPHEDEMA/ONCOLOGY QUESTIONNAIRE - 07/26/16 1051      Right Upper Extremity Lymphedema   10 cm Proximal to Olecranon Process 32.5 cm   Olecranon Process 26.5 cm   15 cm Proximal to Ulnar Styloid Process 26 cm   Just Proximal to Ulnar Styloid Process 16 cm   Across Hand at [employer] 19.5 cm   At [place] of 2nd Digit 6 cm     Left Upper Extremity Lymphedema   10 cm Proximal to Olecranon Process 32 cm   Olecranon Process 26 cm   15 cm Proximal to Ulnar Styloid Process 25 cm   Just Proximal to Ulnar Styloid Process 15 cm  had tight watch on wrist   Across Hand at [employer] 19 cm   At [place] of 2nd Digit 6 cm           Quick Dash - 07/26/16 0001    Open a tight or new jar Mild difficulty   Do heavy household chores (wash walls, wash floors) Unable   Carry a shopping bag or briefcase No difficulty   Wash your back Mild difficulty   Use a knife to cut food No difficulty   Recreational activities in which you take some force or impact through your arm, shoulder, or hand (golf, hammering, tennis) Unable   During the past week, to what extent has your arm, shoulder or hand problem interfered with your normal social activities with family, friends, neighbors, or groups? Slightly   During the past week, to what extent has  your arm, shoulder or hand problem limited your work or other regular daily activities Slightly   Arm, shoulder, or hand pain. Mild   Tingling (pins and needles) in your arm, shoulder, or hand Moderate   Difficulty Sleeping Mild difficulty  DASH Score 36.36 %             [facility] Adult PT Treatment/Exercise - 07/26/16 0001      Self-Care   Self-Care Other Self-Care Comments   Other Self-Care Comments  instructed in supine dowel rod flexion, encourged to keep bandeau up in axilla, gave prescriptoin form for class one compression sleeve and glove for MD to sign, signed up for ABC class                 PT Education - 07/26/16 1122    Education provided Yes   Education Details dowel rod exercise, ABC class scheduled    Person(s) Educated Patient   Methods Explanation;Demonstration   Comprehension Returned demonstration;Verbalized understanding                [place] - 07/26/16 1203      [place] Term Goal  #1   Title Patient with verbalize an understanding of lymphedema risk reduction precautions   Time 4   Period Weeks   Status New     [facility] Long Term Goal  #2   Title Patient will be independent in a  home exercise program   Time 4   Period Weeks   Status New     CC Long Term Goal  #3   Title pt will have 150 degrees of active right shoulder flexion without pain so that she can return to normal activiites    Time 4   Period Weeks   Status New            Plan - 07/26/16 1147    Clinical Impression Statement Pt is about 2 1/2 weeks post mastectomy and has some decreased range of motion and discomfort  in right shoulder with decreased funciton. She lives a distance away from our clinic, but states she comes to [place] frequently  She has no other co-morbidities so this is a low complexity eval    Rehab Potential Good   Clinical Impairments Affecting Rehab Potential remote history of radiation to right upper quadrant    PT Duration 4  weeks   PT Treatment/Interventions ADLs/Self Care Home Management;Patient/family education;Passive range of motion;Manual lymph drainage;Therapeutic exercise;Therapeutic activities;Manual techniques;Taping;DME Instruction   PT Next Visit Plan Review lymphedema risk reduction from ABC class, Progress HEP, Acitve and passive ROM  later review progression of strengthening at the gym., make sure pt has gotten sleeve for flight in January    PT Home Exercise Plan supiine dowel rod exercise    Recommended Other Services ABC class    Consulted and Agree with Plan of Care Patient      Patient will benefit from skilled therapeutic intervention in order to improve the following deficits and impairments:  Decreased knowledge of precautions, Decreased knowledge of use of DME, Postural dysfunction, Decreased range of motion, Decreased strength, Increased fascial restricitons  Visit Diagnosis: Aftercare following surgery for neoplasm  Stiffness of right shoulder, not elsewhere classified     Problem List Patient Active Problem List   Diagnosis Date Noted  . 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 Genetic testing 06/22/2016  . Breast cancer of upper-outer quadrant of right female breast ([place]) 06/18/2016  . Family history of breast cancer   . Family history of prostate cancer   . Family history of pancreatic cancer   . Family history of colon cancer   . Cancer of lower-outer quadrant of female breast right TisNoMo S/P lumpetomy, radiation, declined  hormones 04/2008 06/22/2011   Donato Heinz. Owens Shark PT  Norwood Levo 07/26/2016, 12:05 PM  [place], [place], [address] Phone: [phone]   Fax:  [phone]  Name: LINDZI WEISENBACH MRN: [id number] Date of Birth: 08-Jan-1956

## 2016-07-30 ENCOUNTER — Ambulatory Visit (HOSPITAL_BASED_OUTPATIENT_CLINIC_OR_DEPARTMENT_OTHER): Payer: BC Managed Care – PPO

## 2016-07-30 ENCOUNTER — Encounter: Payer: Self-pay | Admitting: Hematology

## 2016-07-30 ENCOUNTER — Other Ambulatory Visit: Payer: Self-pay | Admitting: *Deleted

## 2016-07-30 ENCOUNTER — Telehealth: Payer: Self-pay | Admitting: Hematology

## 2016-07-30 ENCOUNTER — Ambulatory Visit (HOSPITAL_BASED_OUTPATIENT_CLINIC_OR_DEPARTMENT_OTHER): Payer: BC Managed Care – PPO | Admitting: Hematology

## 2016-07-30 VITALS — BP 109/52 | HR 61 | Temp 98.6°F | Resp 18 | Ht 68.5 in | Wt 186.5 lb

## 2016-07-30 DIAGNOSIS — Z86000 Personal history of in-situ neoplasm of breast: Secondary | ICD-10-CM | POA: Diagnosis not present

## 2016-07-30 DIAGNOSIS — D0511 Intraductal carcinoma in situ of right breast: Secondary | ICD-10-CM

## 2016-07-30 DIAGNOSIS — C50511 Malignant neoplasm of lower-outer quadrant of right female breast: Secondary | ICD-10-CM

## 2016-07-30 DIAGNOSIS — Z170 Estrogen receptor positive status [ER+]: Secondary | ICD-10-CM | POA: Diagnosis not present

## 2016-07-30 LAB — COMPREHENSIVE METABOLIC PANEL
ALK PHOS: 72 U/L (ref 40–150)
ALT: 39 U/L (ref 0–55)
AST: 33 U/L (ref 5–34)
Albumin: 3.6 g/dL (ref 3.5–5.0)
Anion Gap: 7 mEq/L (ref 3–11)
BUN: 14.7 mg/dL (ref 7.0–26.0)
CHLORIDE: 108 meq/L (ref 98–109)
CO2: 25 mEq/L (ref 22–29)
Calcium: 10 mg/dL (ref 8.4–10.4)
Creatinine: 0.8 mg/dL (ref 0.6–1.1)
EGFR: 82 mL/min/{1.73_m2} — AB (ref >90)
GLUCOSE: 92 mg/dL (ref 70–140)
POTASSIUM: 4.2 meq/L (ref 3.5–5.1)
SODIUM: 140 meq/L (ref 136–145)
Total Bilirubin: 0.44 mg/dL (ref 0.20–1.20)
Total Protein: 7.5 g/dL (ref 6.4–8.3)

## 2016-07-30 LAB — CBC WITH DIFFERENTIAL/PLATELET
BASO%: 0.5 % (ref 0.0–2.0)
BASOS ABS: 0 10*3/uL (ref 0.0–0.1)
EOS ABS: 0.2 10*3/uL (ref 0.0–0.5)
EOS%: 3.3 % (ref 0.0–7.0)
HCT: 39.2 % (ref 34.8–46.6)
HGB: 12.8 g/dL (ref 11.6–15.9)
LYMPH%: 41.2 % (ref 14.0–49.7)
MCH: 28.4 pg (ref 25.1–34.0)
MCHC: 32.6 g/dL (ref 31.5–36.0)
MCV: 87.1 fL (ref 79.5–101.0)
MONO#: 0.3 10*3/uL (ref 0.1–0.9)
MONO%: 5.5 % (ref 0.0–14.0)
NEUT#: 3 10*3/uL (ref 1.5–6.5)
NEUT%: 49.5 % (ref 38.4–76.8)
Platelets: 355 10*3/uL (ref 145–400)
RBC: 4.49 10*6/uL (ref 3.70–5.45)
RDW: 13.8 % (ref 11.2–14.5)
WBC: 6.1 10*3/uL (ref 3.9–10.3)
lymph#: 2.5 10*3/uL (ref 0.9–3.3)

## 2016-07-30 MED ORDER — ANASTROZOLE 1 MG PO TABS: 1.0000 mg | ORAL_TABLET | Freq: Every day | ORAL | 2 refills | Status: DC

## 2016-07-30 NOTE — Progress Notes (Signed)
[place]  Telephone:([phone]) [phone] Fax:([phone]  Clinic Follow Up Note   Patient Care Team: Marylynn Pearson, MD as PCP - General (Obstetrics and Gynecology) Excell Seltzer, MD as Consulting Physician (General Surgery) Truitt Merle, MD as Consulting Physician (Hematology) 07/30/2016  CHIEF COMPLAINTS:  Follow up recurrent right breast DCIS   Oncology History   Breast cancer of upper-outer quadrant of right female breast [facility])   Staging form: Breast, AJCC 7th Edition   - Clinical stage from 05/15/2016: Stage 0 (Tis (DCIS), N0, M0) - Signed by Truitt Merle, MD on 06/18/2016   - Pathologic stage from 07/09/2016: Stage 0 (Tis (DCIS), N0, cM0) - Signed by Truitt Merle, MD on 07/30/2016  Cancer of lower-outer quadrant of female breast right TisNoMo S/P lumpetomy, radiation, declined hormones 04/2008   Staging form: Breast, AJCC 7th Edition   - Clinical stage from 04/21/2008: Stage 0 (Tis (DCIS), N0, M0) - Signed by Truitt Merle, MD on 06/18/2016      Cancer of lower-outer quadrant of female breast right TisNoMo S/P lumpetomy, radiation, declined hormones 04/2008   05/16/2011 Initial Diagnosis    Cancer of lower-outer quadrant of female breast right TisNoMo S/P lumpetomy, radiation, declined hormones 04/2008       Breast cancer of upper-outer quadrant of right female breast ([place])   04/20/2016 Mammogram    Screening mammogram showed no evidence of malignancy. Expected post lumpectomy change in the right breast.      04/30/2016 Imaging    Bilateral breast MRI with and without contrast showed new enhancement measuring 2.3 x 1.2 x 0.8 cm in the upper outer quadrant of the right lumpectomy site and extending anteriorly, suspicious for recurrent DCIS. Interval increase in number and size of level I, 2 and 3 right axillary lymph node, largest 1 cm.      05/09/2016 Imaging    Ultrasound of the right breast and axilla showed no sonographic abnormality, no suspicious adenopathy by  ultrasound. Moderately prominent lymph nodes may be reactive and related to recent flu vaccine.      05/15/2016 Initial Biopsy    MRI guided right breast core needle biopsy showed DCIS with calcifications, intermediate to high-grade.      05/15/2016 Receptors her2    ER 100% positive, PR 100% positive      05/15/2016 Initial Diagnosis    Breast cancer of upper-outer quadrant of right female breast ([place])      07/09/2016 Surgery    Right mastectomy and SLN biopsy       07/09/2016 Pathology Results    Right mastectomy showed DCIS with necrosis and calcification, high grade,  2 cm, recurrent, margins were negative, 2 sentinel lymph nodes were negative.       HISTORY OF PRESENTING ILLNESS:  Kim Webster 60 y.o. female is here because of her recently diagnosed recurrent right breast DCIS. She is accompanied by her husband to my clinic today. She was referred by her surgeon Dr. Excell Seltzer.   She has a right breast DCIS, status post lumpectomy and are adjuvant radiation in 2009, she was seen by a medical oncologist Dr. Berdie Ogren at that time and decided not to pursue chemoprevention due to the limited benefit and concern of side effects. She has been followed by annual screening mammogram since then, her last one was unremarkable in early September 2017. She has been also doing screening breast MRI every 2 years, and she had a repeated MRI on 04/30/2016, which showed a 2.3 cm new enhancement in  the upper outer quadrant of right breast, and the previous lumpectomy site. The MRI also showed prominent right axillary lymph nodes. She subsequently underwent ultrasound of the right breast and axilla, which was negative, lymph nodes are slightly prominent, but not suspicious for malignancy. It was felt to be related to her recent flu shot. She underwent MRI guided right breast mass biopsy, which showed DCIS with calcification, intermediate to high-grade, ER and PR strongly positive.  She feels well, no  complains, mild arhtritis in bilateral sounds, no other significant arthralgia. She is a retired Pharmacist, hospital, lives with her husband, remains to be physically active, has good appetite and energy level, no other complaints.  GYN HISTORY  Menarchal: 15 LMP: 24 Contraceptive: 4 years  HRT: none  G3P1: one son 25 yo   CURRENT THERAPY: pending Anastrozole 45m daily   INTERIM HISTORY:  Mrs WBittingerreturns for follow up. She underwent right breast mastectomy and sentinel lymph node biopsy on 07/09/2016. She has recovered well from surgery, has minimal pain at the incision sites, her drain tube was removed last week. She is going to start physical therapy in a few weeks. No other new complaints.  MEDICAL HISTORY:  Past Medical History:  Diagnosis Date  . Arthritis    "mild; thumb joints" (07/09/2016)  . Blood transfusion 1986   "related to ruptured ectopic pregnancy"  . Breast cancer, right breast ([place] 2009; 06/2016  . Ectopic pregnancy 1986   ruptured  . Family history of breast cancer   . Family history of colon cancer   . Family history of pancreatic cancer   . Family history of prostate cancer   . Hypothyroidism   . Migraine    "~ twice/year" (07/09/2016)  . Pneumonia ~ 2007   "walking pneumonia"  . Thyroid disease    hypothyroism    SURGICAL HISTORY: Past Surgical History:  Procedure Laterality Date  . BREAST BIOPSY Right 2009; 05/2016  . BREAST LUMPECTOMY Right 2009  . ECTOPIC PREGNANCY SURGERY  1986  . MASTECTOMY COMPLETE / SIMPLE W/ SENTINEL NODE BIOPSY Right 07/09/2016  . MASTECTOMY W/ SENTINEL NODE BIOPSY Right 07/09/2016   Procedure: RIGHT BREAST MASTECTOMY WITH RIGHT SENTINEL LYMPH NODE BIOPSY;  Surgeon: BExcell Seltzer MD;  Location[place] [facility]  Service: General;  Laterality: Right;    SOCIAL HISTORY: Social History   Social History  . Marital status: Married    Spouse name: N/A  . Number of children: N/A  . Years of education: N/A   Occupational History  .  Not on file.   Social History Main Topics  . Smoking status: Never Smoker  . Smokeless tobacco: Never Used  . Alcohol use No  . Drug use: No  . Sexual activity: Yes   Other Topics Concern  . Not on file   Social History Narrative  . No narrative on file    FAMILY HISTORY: Family History  Problem Relation Age of Onset  . Cancer Mother 625   breast cancer  . Cancer Father 833   liver cancer   . Bladder Cancer Father   . Prostate cancer Father   . Cancer Paternal Aunt 717   colon cancer   . Cancer Paternal Grandmother [phone]   pancreatic cancer     ALLERGIES:  is allergic to vicodin [hydrocodone-acetaminophen].  MEDICATIONS:  Current Outpatient Prescriptions  Medication Sig Dispense Refill  . anastrozole (ARIMIDEX) 1 MG tablet Take 1 tablet (1 mg total) by mouth daily. 30 tablet 2  .  Calcium Carbonate-Vitamin D (CALTRATE 600+D PO) Take 1 tablet by mouth daily at 12 noon.     . Calcium-Vitamin D-Vitamin K (VIACTIV) 568-127-51 MG-UNT-MCG CHEW Chew 1 tablet by mouth daily at 12 noon.    Marland Kitchen ibuprofen (MOTRIN IB) 200 MG tablet Take 200 mg by mouth every 8 (eight) hours as needed (for pain.).    Marland Kitchen levothyroxine (SYNTHROID, LEVOTHROID) 88 MCG tablet Take 88 mcg by mouth daily before breakfast.     . mometasone (ELOCON) 0.1 % lotion APPLY TO AFFECTED AREA TWICE A DAY AS NEEDED FOR SORES ON SCALP  6  . Multiple Vitamins-Minerals (CENTRUM SILVER PO) Take 1 tablet by mouth daily at 12 noon.     Marland Kitchen oxyCODONE-acetaminophen (PERCOCET/ROXICET) 5-325 MG tablet Take 1-2 tablets by mouth every 4 (four) hours as needed for moderate pain. (Patient not taking: Reported on 07/26/2016) 20 tablet 0   No current facility-administered medications for this visit.     REVIEW OF SYSTEMS:   Constitutional: Denies fevers, chills or abnormal night sweats Eyes: Denies blurriness of vision, double vision or watery eyes Ears, nose, mouth, throat, and face: Denies mucositis or sore throat Respiratory: Denies  cough, dyspnea or wheezes Cardiovascular: Denies palpitation, chest discomfort or lower extremity swelling Gastrointestinal:  Denies nausea, heartburn or change in bowel habits Skin: Denies abnormal skin rashes Lymphatics: Denies new lymphadenopathy or easy bruising Neurological:Denies numbness, tingling or new weaknesses Behavioral/Psych: Mood is stable, no new changes  All other systems were reviewed with the patient and are negative.  PHYSICAL EXAMINATION: ECOG PERFORMANCE STATUS: 0 - Asymptomatic  Vitals:   07/30/16 0929  BP: (!) 109/52  Pulse: 61  Resp: 18  Temp: 98.6 F (37 C)   Filed Weights   07/30/16 0929  Weight: 186 lb 8 oz (84.6 kg)    GENERAL:alert, no distress and comfortable SKIN: skin color, texture, turgor are normal, no rashes or significant lesions EYES: normal, conjunctiva are pink and non-injected, sclera clear OROPHARYNX:no exudate, no erythema and lips, buccal mucosa, and tongue normal  NECK: supple, thyroid normal size, non-tender, without nodularity LYMPH:  no palpable lymphadenopathy in the cervical, axillary or inguinal LUNGS: clear to auscultation and percussion with normal breathing effort HEART: regular rate & rhythm and no murmurs and no lower extremity edema ABDOMEN:abdomen soft, non-tender and normal bowel sounds Musculoskeletal:no cyanosis of digits and no clubbing  PSYCH: alert & oriented x 3 with fluent speech NEURO: no focal motor/sensory deficits Breasts: Status post right mastectomy, surgical incision is healing well, no skin erythema or discharge. Palpation of the left breast and axilla revealed no obvious mass that I could appreciate.   LABORATORY DATA:  I have reviewed the data as listed CBC Latest Ref Rng & Units 07/30/2016 07/10/2016 07/04/2016  WBC 3.9 - 10.3 10e3/uL 6.1 9.7 7.8  Hemoglobin 11.6 - 15.9 g/dL 12.8 10.7(L) 12.7  Hematocrit 34.8 - 46.6 % 39.2 32.3(L) 38.6  Platelets 145 - 400 10e3/uL 355 272 318   CMP Latest Ref  Rng & Units 07/30/2016 07/21/2008  Glucose 70 - 140 mg/dl 92 66(L)  BUN 7.0 - 26.0 mg/dL 14.7 18  Creatinine 0.6 - 1.1 mg/dL 0.8 0.82  Sodium 136 - 145 mEq/L 140 140  Potassium 3.5 - 5.1 mEq/L 4.2 4.3  Chloride 96 - 112 mEq/L - 104  CO2 22 - 29 mEq/L 25 25  Calcium 8.4 - 10.4 mg/dL 10.0 9.8  Total Protein 6.4 - 8.3 g/dL 7.5 7.2  Total Bilirubin 0.20 - 1.20 mg/dL 0.44 0.2(L)  Alkaline Phos 40 - 150 U/L 72 53  AST 5 - 34 U/L 33 22  ALT 0 - 55 U/L 39 16   PATHOLOGY REPORT  Diagnosis 05/15/2016 Breast, right, needle core biopsy, lateral - DUCTAL CARCINOMA IN SITU WITH CALCIFICATIONS. - FIBROCYSTIC CHANGES WITH ADENOSIS AND CALCIFICATIONS. - SEE COMMENT. The carcinoma appears intermediate to high grade. Estrogen receptor and progesterone receptor studies will be performed and the results reported separately. The results were called to [facility] on 05/16/16. (JBK:gt, 05/16/16)  Results: IMMUNOHISTOCHEMICAL AND MORPHOMETRIC ANALYSIS PERFORMED MANUALLY Estrogen Receptor: 100%, POSITIVE, STRONG STAINING INTENSITY Progesterone Receptor: 100%, POSITIVE, STRONG STAINING INTENSITY  Diagnosis 07/09/2016 1. Lymph node, sentinel, biopsy, Right axillary #1 - ONE BENIGN LYMPH NODE (0/1). 2. Lymph node, sentinel, biopsy, Right axillary #2 - ONE BENIGN LYMPH NODE (0/1). 3. Breast, simple mastectomy, Right - DUCTAL CARCINOMA IN SITU WITH NECROSIS AND CALCIFICATIONS, 2 CM, RECURRENT. - MARGINS NOT INVOLVED. - PREVIOUS BIOPSY SITE. Microscopic Comment 3. BREAST, IN SITU CARCINOMA Specimen, including laterality: Right breast and two sentinel lymph nodes Procedure (include lymph node sampling sentinel-non-sentinel): Mastectomy and sentinel lymph node biopsies Grade of carcinoma: High grade Necrosis: Present Estimated tumor size: (glass slide measurement): 2 cm Distance to closest margin: 1.5 cm from posterior margin If margin positive, focally or broadly: N/A Breast  prognostic profile: Case [id number] Estrogen receptor: 100%, positive, strong staining Progesterone receptor: 100%, positive, strong staining Lymph nodes: Examined: 2 Sentinel 0 Non-sentinel 2 Total Lymph nodes with metastasis: 0 Isolated tumor cells (< 0.2 mm): 0 Micrometastasis ( > 0.2 mm and < 2.0 mm): 0 Macrometastasis (> 2.0 mm): 0 Extranodal extension: N/A 1 of 2 FINAL for Ericksen, Omni W [phone]) Microscopic Comment(continued) TNM: pTis, pN0, recurrent Comments: There is a 2 cm area of high grade ductal carcinoma in situ adjacent to the previous needle core biopsy and there is dense fibrosis consistent with previous lumpectomy site. (JDP:kh 07-10-16)  RADIOGRAPHIC STUDIES: I have personally reviewed the radiological images as listed and agreed with the findings in the report. No results found.  ASSESSMENT & PLAN:  60 y.o. postmenopausal woman, presented with screening discovered DCIS in 04/2016, prior history of right breast DCIS in  2009.  1. Right breast DCIS with necrosis, high-grade DCIS, ER+ /PR+ -I previously discussed her breast imaging and needle biopsy results with patient and her husband in great detail. -This is a local recurrence at the same lumpectomy site from 2009 -Due to her prior breast radiation and the recurrent disease, she underwent right mastectomy and sentinel lymph node biopsy -I reviewed her surgical pathology findings, which confirmed high-grade DCIS with 2 courses, surgical margins were negative. Sentinel lymph nodes were negative. -due to her recurrent breast cancer, and family history of multiple cancers, she underwent genetic testing and it was negative  -Her DCIS will be cured by complete surgical resection. Any form of adjuvant therapy is preventive. Given her recurrent disease, positive family history, she does have high risk for breast cancer in the future. -Given her strongly positive ER and PR, I do recommend antiestrogen therapy with  anastrozole which decrease her risk of future breast cancer by ~40%.  -The potential benefit and side effects, which includes but not limited to, hot flash, skin and vaginal dryness, metabolic changes ( increased blood glucose, cholesterol, weight, etc.), slightly in increased risk of cardiovascular disease, cataracts, muscular and joint discomfort, osteopenia and osteoporosis, etc, were discussed with her in great details.she agrees to proceed  -she will start  in Jan 2018  -We discussed breast cancer surveillance, Including annual mammogram, breast exam every 6-12 months.  2. Bone health -We discussed her that anastrozole may weak her bone -The patient, she had normal bone density scan in 2006, will repeat every 2 years -I encouraged her to continue calcium and vitamin D supplement, and exercise   Plan -She has a trip schedule for Jan, and will start Anastrozole in mid Feb when she returns  -I will see her back in late March for follow up, lab today and next visit   All questions were answered. The patient knows to call the clinic with any problems, questions or concerns.  I spent 25 minutes counseling the patient face to face. The total time spent in the appointment was 30 minutes and more than 50% was on counseling.     Truitt Merle, MD 07/30/2016

## 2016-07-30 NOTE — Telephone Encounter (Signed)
Gave patient avs report and appointments for March  °

## 2016-08-01 ENCOUNTER — Ambulatory Visit: Payer: BC Managed Care – PPO | Admitting: Physical Therapy

## 2016-08-01 DIAGNOSIS — Z483 Aftercare following surgery for neoplasm: Secondary | ICD-10-CM | POA: Diagnosis not present

## 2016-08-01 DIAGNOSIS — M25611 Stiffness of right shoulder, not elsewhere classified: Secondary | ICD-10-CM

## 2016-08-01 NOTE — Therapy (Signed)
[place], [place], [address] Phone: [phone]   Fax:  [phone]  Physical Therapy Treatment  Patient Details  Name: Kim Webster MRN: [id number] Date of Birth: November 24, 1955 Referring Provider: Dr. Excell Seltzer   Encounter Date: 08/01/2016      PT End of Session - 08/01/16 1157    Visit Number 2   Number of Visits 5   Date for PT Re-Evaluation 08/31/16   PT Start Time 0930   PT Stop Time 1015   PT Time Calculation (min) 45 min   Activity Tolerance Patient tolerated treatment well   Behavior During Therapy [facility] for tasks assessed/performed      Past Medical History:  Diagnosis Date  . Arthritis    "mild; thumb joints" (07/09/2016)  . Blood transfusion 1986   "related to ruptured ectopic pregnancy"  . Breast cancer, right breast ([place]) 2009; 06/2016  . Ectopic pregnancy 1986   ruptured  . Family history of breast cancer   . Family history of colon cancer   . Family history of pancreatic cancer   . Family history of prostate cancer   . Hypothyroidism   . Migraine    "~ twice/year" (07/09/2016)  . Pneumonia ~ 2007   "walking pneumonia"  . Thyroid disease    hypothyroism    Past Surgical History:  Procedure Laterality Date  . BREAST BIOPSY Right 2009; 05/2016  . BREAST LUMPECTOMY Right 2009  . ECTOPIC PREGNANCY SURGERY  1986  . MASTECTOMY COMPLETE / SIMPLE W/ SENTINEL NODE BIOPSY Right 07/09/2016  . MASTECTOMY W/ SENTINEL NODE BIOPSY Right 07/09/2016   Procedure: RIGHT BREAST MASTECTOMY WITH RIGHT SENTINEL LYMPH NODE BIOPSY;  Surgeon: Excell Seltzer, MD;  Location: [facility];  Service: General;  Laterality: Right;    There were no vitals filed for this visit.      Subjective Assessment - 08/01/16 0930    Subjective Attended ABC class and feels comfortable with lymphedema risk reduction.  She has received her sleeve and gauntlet and knows to wear it on her flight.  she has been doing  the dowel rod exercise.  She still is a little sore from doing the exercises at [facility] class    Pertinent History right mastetomy on NOv. 27. 2017 with 2 lymph nodes, will not have chemotherapy, had radiation in 2009, with not problem. will go to see Dr. Burr Medico about hormone therapy    Patient Stated Goals wants to learn exercises and about lymphedema    Currently in Pain? No/denies  call it more tightness             [facility] PT Assessment - 08/01/16 0001      AROM   Right Shoulder Flexion 144 Degrees  without pain    Right Shoulder ABduction 140 Degrees  without pain                      [facility] Adult PT Treatment/Exercise - 08/01/16 0001      Self-Care   Self-Care Other Self-Care Comments   Other Self-Care Comments  provided chip pack to right anterior axilla tied around chest with stockinette to try to keep it in place      Shoulder Exercises: Pulleys   Flexion 2 minutes   ABduction 2 minutes     Shoulder Exercises: ROM/Strengthening   Hussar Wash with towel for half circle  with right  and flexio with both arms    Other ROM/Strengthening Exercises UE  ranger for flexion and diagaonal movements      Manual Therapy   Manual Therapy Manual Lymphatic Drainage (MLD);Passive ROM   Manual Lymphatic Drainage (MLD) briefly with instruction for self care, short neck, diaphragmatic breathing, left axillary nodes, right inguinal nodes, anterior interaxillary anastamosis, with extra time spent on puffiness at right anterior axilla.   Passive ROM to right shoulder into flexion and abduction                         Long Term Clinic Goals - 08/01/16 0935      CC Long Term Goal  #1   Title Patient with verbalize an understanding of lymphedema risk reduction precautions   Baseline attended ABC class    Status Achieved     CC Long Term Goal  #2   Title Patient will be independent in a  home exercise program   Status On-going     CC Long Term Goal  #3   Title pt  will have 150 degrees of active right shoulder flexion without pain so that she can return to normal activiites    Status On-going            Plan - 08/01/16 1157    Clinical Impression Statement Pt has made some good initial gains, but she still has tightness in axilla and across chest.  Upgraded home program to include more stretches.    Rehab Potential Good   Clinical Impairments Affecting Rehab Potential remote history of radiation to right upper quadrant    PT Frequency Other (comment)  intermittent visits   PT Duration 4 weeks   PT Treatment/Interventions ADLs/Self Care Home Management;Patient/family education;Passive range of motion;Manual lymph drainage;Therapeutic exercise;Therapeutic activities;Manual techniques;Taping;DME Instruction   PT Next Visit Plan ball up Fee exercise for end range stretch, teach strengthening with supine scap series and how to progress resistance at the gym    Consulted and Agree with Plan of Care Patient      Patient will benefit from skilled therapeutic intervention in order to improve the following deficits and impairments:  Decreased knowledge of precautions, Decreased knowledge of use of DME, Postural dysfunction, Decreased range of motion, Decreased strength, Increased fascial restricitons  Visit Diagnosis: Aftercare following surgery for neoplasm  Stiffness of right shoulder, not elsewhere classified     Problem List Patient Active Problem List   Diagnosis Date Noted  . 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 Genetic testing 06/22/2016  . 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 Family history of breast cancer   . Family history of prostate cancer   . Family history of pancreatic cancer   . Family history of colon cancer   . 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 Owens Shark PT  Norwood Levo 08/01/2016, 12:00 PM  [place], [place], [address] Phone: [phone]   Fax:  [phone]  Name: Kim Webster MRN: [id number] Date of Birth: [birth year]

## 2016-08-01 NOTE — Patient Instructions (Signed)
Flexibility: Corner Stretch    Standing in corner with hands just above  , lean forward until a comfortable stretch is felt across chest. Hold __3_ seconds. Repeat __3__ times per set. SHOULDER: Flexion At [place]    Slide both arms up [place] while leaning gently into [place]. Maintain upright posture and tuck in stomach. Hold _3__ seconds. __5_ reps per set,   Copyright  VHI. All rights reserved.    http://orth.exer.us/343   Copyright  VHI. All rights reserved.

## 2016-08-03 ENCOUNTER — Telehealth: Payer: Self-pay | Admitting: Medical Oncology

## 2016-08-03 NOTE — Telephone Encounter (Signed)
Pt notified that CBC and CMp was normal.

## 2016-08-14 ENCOUNTER — Ambulatory Visit: Payer: BC Managed Care – PPO | Attending: General Surgery | Admitting: Physical Therapy

## 2016-08-14 DIAGNOSIS — M25611 Stiffness of right shoulder, not elsewhere classified: Secondary | ICD-10-CM | POA: Insufficient documentation

## 2016-08-14 DIAGNOSIS — Z483 Aftercare following surgery for neoplasm: Secondary | ICD-10-CM | POA: Insufficient documentation

## 2016-08-14 NOTE — Patient Instructions (Signed)
Start doing these lying on your back.  As you get stronger, progress to doing them in sitting and then to standing   Start with red band with 5 reps and progress to 10 reps, then to green band with 5 reps and progress        Over Head Pull: Narrow Grip   Also with wide grip         On back, knees bent, feet flat, band across thighs, elbows straight but relaxed. Pull hands apart (start). Keeping elbows straight, bring arms up and over head, hands toward floor. Keep pull steady on band. Hold momentarily. Return slowly, keeping pull steady, back to start. Repeat _5-10__ times. Band color _red_____   Side Pull: Double Arm   On back, knees bent, feet flat. Arms perpendicular to body, shoulder level, elbows straight but relaxed. Pull arms out to sides, elbows straight. Resistance band comes across collarbones, hands toward floor. Hold momentarily. Slowly return to starting position. Repeat 5-10__ times. Band color _red____   Elmer Picker   On back, knees bent, feet flat, left hand on left hip, right hand above left. Pull right arm DIAGONALLY (hip to shoulder) across chest. Bring right arm along head toward floor. Hold momentarily. Slowly return to starting position. Repeat5-10___ times. Do with left arm. Band color _red_____   Shoulder Rotation: Double Arm   On back, knees bent, feet flat, elbows tucked at sides, bent 90, hands palms up. Pull hands apart and down toward floor, keeping elbows near sides. Hold momentarily. Slowly return to starting position. Repeat 5-10___ times. Band color __red____

## 2016-08-14 NOTE — Therapy (Signed)
[place], [place], [address] Phone: [phone]   Fax:  [phone]  Physical Therapy Treatment  Patient Details  Name: Kim Webster MRN: [id number] Date of Birth: 10/23/1955 Referring Provider: Dr. Excell Seltzer   Encounter Date: 08/14/2016      PT End of Session - 08/14/16 1418    Visit Number 3   Number of Visits 5   Date for PT Re-Evaluation 08/31/16   PT Start Time 1300   PT Stop Time 1340   PT Time Calculation (min) 40 min   Activity Tolerance Patient tolerated treatment well   Behavior During Therapy [facility] for tasks assessed/performed      Past Medical History:  Diagnosis Date  . Arthritis    "mild; thumb joints" (07/09/2016)  . Blood transfusion 1986   "related to ruptured ectopic pregnancy"  . Breast cancer, right breast ([place]) 2009; 06/2016  . Ectopic pregnancy 1986   ruptured  . Family history of breast cancer   . Family history of colon cancer   . Family history of pancreatic cancer   . Family history of prostate cancer   . Hypothyroidism   . Migraine    "~ twice/year" (07/09/2016)  . Pneumonia ~ 2007   "walking pneumonia"  . Thyroid disease    hypothyroism    Past Surgical History:  Procedure Laterality Date  . BREAST BIOPSY Right 2009; 05/2016  . BREAST LUMPECTOMY Right 2009  . ECTOPIC PREGNANCY SURGERY  1986  . MASTECTOMY COMPLETE / SIMPLE W/ SENTINEL NODE BIOPSY Right 07/09/2016  . MASTECTOMY W/ SENTINEL NODE BIOPSY Right 07/09/2016   Procedure: RIGHT BREAST MASTECTOMY WITH RIGHT SENTINEL LYMPH NODE BIOPSY;  Surgeon: Excell Seltzer, MD;  Location: [facility];  Service: General;  Laterality: Right;    There were no vitals filed for this visit.      Subjective Assessment - 08/14/16 1300    Subjective Pt has had continued improvement and feels that she is reaching better. She still has some tightness compared to other side, but overall is better.   She is pleased that  she can reach  up form a high shelf and do her hair without difficulty    Pertinent History right mastetomy on NOv. 27. 2017 with 2 lymph nodes, will not have chemotherapy, had radiation in 2009, with not problem. will go to see Dr. Burr Medico about hormone therapy    Patient Stated Goals wants to learn exercises and about lymphedema    Currently in Pain? No/denies            [facility] PT Assessment - 08/14/16 0001      Observation/Other Assessments   Quick DASH  4.55     AROM   Right Shoulder Flexion 165 Degrees   Right Shoulder ABduction 168 Degrees              Quick Dash - 08/14/16 0001    Open a tight or new jar No difficulty   Do heavy household chores (wash walls, wash floors) Mild difficulty   Carry a shopping bag or briefcase No difficulty   Wash your back No difficulty   Use a knife to cut food No difficulty   Recreational activities in which you take some force or impact through your arm, shoulder, or hand (golf, hammering, tennis) No difficulty   During the past week, to what extent has your arm, shoulder or hand problem interfered with your normal social activities with family, friends, neighbors, or groups?  Not at all   During the past week, to what extent has your arm, shoulder or hand problem limited your work or other regular daily activities Not at all   Arm, shoulder, or hand pain. None   Tingling (pins and needles) in your arm, shoulder, or hand Mild   Difficulty Sleeping No difficulty   DASH Score 4.55 %               [facility] Adult PT Treatment/Exercise - 08/14/16 0001      Shoulder Exercises: Supine   Horizontal ABduction Strengthening;Both;5 reps   Theraband Level (Shoulder Horizontal ABduction) Level 2 (Red)   External Rotation Strengthening;Left;5 reps;Theraband   Theraband Level (Shoulder External Rotation) Level 2 (Red)   Flexion Strengthening;Both;5 reps;Theraband  wide and narrow grip    Theraband Level (Shoulder Flexion) Level 2 (Red)    Other Supine Exercises diagonal elevation with red theraband 5 reps with each arm    Other Supine Exercises pt educated in progress with number of reps, red to green band and supine  to sit to stand position  and acknowledged all      Shoulder Exercises: Pulleys   Flexion 2 minutes   ABduction 2 minutes     Shoulder Exercises: ROM/Strengthening   Ball on [place] in forward flexion and diagonals for extra stretch at end range   Other ROM/Strengthening Exercises instructed in strength ABC program for stretches, core strengthening and exercise progression.  Pt is familiar with other exercises and will be using the machines at the gym                 PT Education - 08/14/16 1353    Education provided Yes   Education Details supine scapular series with red band with progression, Stength ABC program and how to apply that at the [facility] with machines    Person(s) Educated Patient   Methods Explanation;Demonstration;Handout   Comprehension Verbalized understanding;Returned demonstration                [facility] Goals - 08/14/16 1420      CC Long Term Goal  #1   Title Patient with verbalize an understanding of lymphedema risk reduction precautions   Baseline attended ABC class    Status Achieved     CC Long Term Goal  #2   Title Patient will be independent in a  home exercise program   Status Achieved     CC Long Term Goal  #3   Title pt will have 150 degrees of active right shoulder flexion without pain so that she can return to normal activiites    Baseline >160 degrees    Status Achieved            Plan - 08/14/16 1418    Clinical Impression Statement Pt has achieved all goal, made marked improvement in her fuctional status and is knowledgable about her self care moving forward.  She has compression sleeve and gauntlet needed for upcoming flight and is ready to discharge from this episode    Rehab Potential Good   Clinical Impairments Affecting Rehab  Potential remote history of radiation to right upper quadrant    PT Treatment/Interventions ADLs/Self Care Home Management;Patient/family education;Passive range of motion;Manual lymph drainage;Therapeutic exercise;Therapeutic activities;Manual techniques;Taping;DME Instruction   PT Next Visit Plan discharge    Consulted and Agree with Plan of Care Patient      Patient will benefit from skilled therapeutic intervention in order to improve the following deficits and  impairments:  Decreased knowledge of precautions, Decreased knowledge of use of DME, Postural dysfunction, Decreased range of motion, Decreased strength, Increased fascial restricitons  Visit Diagnosis: Aftercare following surgery for neoplasm  Stiffness of right shoulder, not elsewhere classified     Problem List Patient Active Problem List   Diagnosis Date Noted  . 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 Genetic testing 06/22/2016  . 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 Family history of breast cancer   . Family history of prostate cancer   . Family history of pancreatic cancer   . Family history of colon cancer   . 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  Patient goals were met. Patient is being discharged due to meeting the stated rehab goals.  ?????    Donato Heinz. Owens Shark PT  Norwood Levo 08/14/2016, 2:21 PM  [place], [place], [address] Phone: [phone]   Fax:  [phone]  Name:  Kim Webster MRN: [id number] Date of Birth: 01/30/1956

## 2016-09-24 ENCOUNTER — Encounter (HOSPITAL_COMMUNITY): Payer: Self-pay

## 2016-10-29 NOTE — Progress Notes (Signed)
[place]  [phone] [phone]  Clinic Follow Up Note   Patient Care Team: Marylynn Pearson, MD as PCP - General (Obstetrics and Gynecology) Excell Seltzer, MD as Consulting Physician (General Surgery) Truitt Merle, MD as Consulting Physician (Hematology) 11/05/2016  CHIEF COMPLAINTS:  Follow up recurrent right breast DCIS   Oncology History   Breast cancer of upper-outer quadrant of right female breast [facility])   Staging form: Breast, AJCC 7th Edition   - Clinical stage from 05/15/2016: Stage 0 (Tis (DCIS), N0, M0) - Signed by Truitt Merle, MD on 06/18/2016   - Pathologic stage from 07/09/2016: Stage 0 (Tis (DCIS), N0, cM0) - Signed by Truitt Merle, MD on 07/30/2016  Cancer of lower-outer quadrant of female breast right [facility] S/P lumpetomy, radiation, declined hormones 04/2008   Staging form: Breast, AJCC 7th Edition   - Clinical stage from 04/21/2008: Stage 0 (Tis (DCIS), N0, M0) - Signed by Truitt Merle, MD on 06/18/2016      Cancer of lower-outer quadrant of female breast right [facility]isNoMo S/P lumpetomy, radiation, declined hormones 04/2008   05/16/2011 Initial Diagnosis    Cancer of lower-outer quadrant of female breast right TisNoMo S/P lumpetomy, radiation, declined hormones 04/2008       Ductal carcinoma in situ (DCIS) of right breast   04/20/2016 Mammogram    Screening mammogram showed no evidence of malignancy. Expected post lumpectomy change in the right breast.      04/30/2016 Imaging    Bilateral breast MRI with and without contrast showed new enhancement measuring 2.3 x 1.2 x 0.8 cm in the upper outer quadrant of the right lumpectomy site and extending anteriorly, suspicious for recurrent DCIS. Interval increase in number and size of level I, 2 and 3 right axillary lymph node, largest 1 cm.      05/09/2016 Imaging    Ultrasound of the right breast and axilla showed no sonographic abnormality, no suspicious adenopathy by ultrasound. Moderately  prominent lymph nodes may be reactive and related to recent flu vaccine.      05/15/2016 Initial Biopsy    MRI guided right breast core needle biopsy showed DCIS with calcifications, intermediate to high-grade.      05/15/2016 Receptors her2    ER 100% positive, PR 100% positive      05/15/2016 Initial Diagnosis    Breast cancer of upper-outer quadrant of right female breast ([place])      07/09/2016 Surgery    Right mastectomy and SLN biopsy       07/09/2016 Pathology Results    Right mastectomy showed DCIS with necrosis and calcification, high grade,  2 cm, recurrent, margins were negative, 2 sentinel lymph nodes were negative.       HISTORY OF PRESENTING ILLNESS:  Kim Webster 61 y.o. female is here because of her recently diagnosed recurrent right breast DCIS. She is accompanied by her husband to my clinic today. She was referred by her surgeon Dr. Excell Seltzer.   She has a right breast DCIS, status post lumpectomy and are adjuvant radiation in 2009, she was seen by a medical oncologist Dr. Berdie Ogren at that time and decided not to pursue chemoprevention due to the limited benefit and concern of side effects. She has been followed by annual screening mammogram since then, her last one was unremarkable in early September 2017. She has been also doing screening breast MRI every 2 years, and she had a repeated MRI on 04/30/2016, which showed a 2.3 cm new enhancement in the upper  outer quadrant of right breast, and the previous lumpectomy site. The MRI also showed prominent right axillary lymph nodes. She subsequently underwent ultrasound of the right breast and axilla, which was negative, lymph nodes are slightly prominent, but not suspicious for malignancy. It was felt to be related to her recent flu shot. She underwent MRI guided right breast mass biopsy, which showed DCIS with calcification, intermediate to high-grade, ER and PR strongly positive.  She feels well, no complains, mild arhtritis in  bilateral sounds, no other significant arthralgia. She is a retired Pharmacist, hospital, lives with her husband, remains to be physically active, has good appetite and energy level, no other complaints.  GYN HISTORY  Menarchal: 15 LMP: 17 Contraceptive: 4 years  HRT: none  G3P1: one son 13 yo   CURRENT THERAPY: pending Anastrozole 26m daily   INTERIM HISTORY:  Mrs WCoppingerreturns for follow up. She is doing well today. She started Anastrozole on 09/25/2016, and is tolerating well other than occasional hot flashes. She has occasional sharp pains in her right breast, at the site of her mastectomy, but it is tolerable. Denies joint pain, joint stiffness, muscle aches, leg swelling, or any other concerns.   MEDICAL HISTORY:  Past Medical History:  Diagnosis Date  . Arthritis    "mild; thumb joints" (07/09/2016)  . Blood transfusion 1986   "related to ruptured ectopic pregnancy"  . [place]; 06[place]  . Ectopic pregnancy 1986   ruptured  . Family history of breast cancer   . Family history of colon cancer   . Family history of pancreatic cancer   . Family history of prostate cancer   . Hypothyroidism   . Migraine    "~ twice/year" (07/09/2016)  . Pneumonia ~ 2007   "walking pneumonia"  . Thyroid disease    hypothyroism    SURGICAL HISTORY: Past Surgical History:  Procedure Laterality Date  . BREAST BIOPSY Right 2009; 05/2016  . BREAST LUMPECTOMY Right 2009  . ECTOPIC PREGNANCY SURGERY  1986  . MASTECTOMY COMPLETE / SIMPLE W/ SENTINEL NODE BIOPSY Right 07/09/2016  . MASTECTOMY W/ SENTINEL NODE BIOPSY Right 07/09/2016   Procedure: RIGHT BREAST MASTECTOMY WITH RIGHT SENTINEL LYMPH NODE BIOPSY;  Surgeon: BExcell Seltzer MD;  [facility]  Service: General;  Laterality: Right;    SOCIAL HISTORY: Social History   Social History  . Marital status: Married    Spouse name: N/A  . Number of children: N/A  . Years of education: N/A   Occupational History    . Not on file.   Social History Main Topics  . Smoking status: Never Smoker  . Smokeless tobacco: Never Used  . Alcohol use No  . Drug use: No  . Sexual activity: Yes   Other Topics Concern  . Not on file   Social History Narrative  . No narrative on file    FAMILY HISTORY: Family History  Problem Relation Age of Onset  . Cancer Mother 630   breast cancer  . Cancer Father 837   liver cancer   . Bladder Cancer Father   . Prostate cancer Father   . Cancer Paternal Aunt 774   colon cancer   . Cancer Paternal Grandmother [phone]   pancreatic cancer     ALLERGIES:  is allergic to vicodin [hydrocodone-acetaminophen].  MEDICATIONS:  Current Outpatient Prescriptions  Medication Sig Dispense Refill  . anastrozole (ARIMIDEX) 1 MG tablet Take 1 tablet (1 mg total) by mouth  daily. 90 tablet 1  . Calcium Carbonate-Vitamin D (CALTRATE 600+D PO) Take 1 tablet by mouth daily at 12 noon.     . Calcium-Vitamin D-Vitamin K (VIACTIV) 161-096-04 MG-UNT-MCG CHEW Chew 1 tablet by mouth daily at 12 noon.    Marland Kitchen ibuprofen (MOTRIN IB) 200 MG tablet Take 200 mg by mouth every 8 (eight) hours as needed (for pain.).    Marland Kitchen levothyroxine (SYNTHROID, LEVOTHROID) 88 MCG tablet Take 88 mcg by mouth daily before breakfast.     . mometasone (ELOCON) 0.1 % lotion APPLY TO AFFECTED AREA TWICE A DAY AS NEEDED FOR SORES ON SCALP  6  . Multiple Vitamins-Minerals (CENTRUM SILVER PO) Take 1 tablet by mouth daily at 12 noon.     Marland Kitchen oxyCODONE-acetaminophen (PERCOCET/ROXICET) 5-325 MG tablet Take 1-2 tablets by mouth every 4 (four) hours as needed for moderate pain. (Patient not taking: Reported on 07/26/2016) 20 tablet 0   No current facility-administered medications for this visit.     REVIEW OF SYSTEMS:   Constitutional: Denies fevers, chills or abnormal night sweats (+) hot flashes (+) sharp pain mastectomy site  Eyes: Denies blurriness of vision, double vision or watery eyes Ears, nose, mouth, throat, and face:  Denies mucositis or sore throat Respiratory: Denies cough, dyspnea or wheezes Cardiovascular: Denies palpitation, chest discomfort or lower extremity swelling Gastrointestinal:  Denies nausea, heartburn or change in bowel habits Skin: Denies abnormal skin rashes Lymphatics: Denies new lymphadenopathy or easy bruising Neurological:Denies numbness, tingling or new weaknesses Behavioral/Psych: Mood is stable, no new changes  All other systems were reviewed with the patient and are negative.  PHYSICAL EXAMINATION: ECOG PERFORMANCE STATUS: 0 - Asymptomatic  Vitals:   11/05/16 0940  BP: (!) 149/82  Pulse: 67  Resp: 18  Temp: 98.1 F (36.7 C)   Filed Weights   11/05/16 0940  Weight: 187 lb (84.8 kg)   GENERAL:alert, no distress and comfortable SKIN: skin color, texture, turgor are normal, no rashes or significant lesions EYES: normal, conjunctiva are pink and non-injected, sclera clear OROPHARYNX:no exudate, no erythema and lips, buccal mucosa, and tongue normal  NECK: supple, thyroid normal size, non-tender, without nodularity LYMPH:  no palpable lymphadenopathy in the cervical, axillary or inguinal LUNGS: clear to auscultation and percussion with normal breathing effort HEART: regular rate & rhythm and no murmurs and no lower extremity edema ABDOMEN:abdomen soft, non-tender and normal bowel sounds Musculoskeletal:no cyanosis of digits and no clubbing  PSYCH: alert & oriented x 3 with fluent speech NEURO: no focal motor/sensory deficits Breasts: Status post right mastectomy, surgical incision is healing well, no skin erythema or discharge. Palpation of the left breast and axilla revealed no obvious mass that I could appreciate.  LABORATORY DATA:  I have reviewed the data as listed CBC Latest Ref Rng & Units 11/05/2016 07/30/2016 07/10/2016  WBC 3.9 - 10.3 10e3/uL 5.5 6.1 9.7  Hemoglobin 11.6 - 15.9 g/dL 13.0 12.8 10.7(L)  Hematocrit 34.8 - 46.6 % 39.9 39.2 32.3(L)  Platelets 145  - 400 10e3/uL 299 355 272   CMP Latest Ref Rng & Units 11/05/2016 07/30/2016 07/21/2008  Glucose 70 - 140 mg/dl 88 92 66(L)  BUN 7.0 - 26.0 mg/dL 13.0 14.7 18  Creatinine 0.6 - 1.1 mg/dL 0.8 0.8 0.82  Sodium 136 - 145 mEq/L 140 140 140  Potassium 3.5 - 5.1 mEq/L 5.1 4.2 4.3  Chloride 96 - 112 mEq/L - - 104  CO2 22 - 29 mEq/L _0 Calcium 8.4 - 10.4 mg/dL 10.0  10.0 9.8  Total Protein 6.4 - 8.3 g/dL 7.5 7.5 7.2  Total Bilirubin 0.20 - 1.20 mg/dL 0.30 0.44 0.2(L)  Alkaline Phos 40 - 150 U/L 64 72 53  AST 5 - 34 U/L 27 33 22  ALT 0 - 55 U/L 24 39 16   PATHOLOGY REPORT  Diagnosis 05/15/2016 Breast, right, needle core biopsy, lateral - DUCTAL CARCINOMA IN SITU WITH CALCIFICATIONS. - FIBROCYSTIC CHANGES WITH ADENOSIS AND CALCIFICATIONS. - SEE COMMENT. The carcinoma appears intermediate to high grade. Estrogen receptor and progesterone receptor studies will be performed and the results reported separately. The results were called to [facility] on 05/16/16. (JBK:gt, 05/16/16)  Results: IMMUNOHISTOCHEMICAL AND MORPHOMETRIC ANALYSIS PERFORMED MANUALLY Estrogen Receptor: 100%, POSITIVE, STRONG STAINING INTENSITY Progesterone Receptor: 100%, POSITIVE, STRONG STAINING INTENSITY  Diagnosis 07/09/2016 1. Lymph node, sentinel, biopsy, Right axillary #1 - ONE BENIGN LYMPH NODE (0/1). 2. Lymph node, sentinel, biopsy, Right axillary #2 - ONE BENIGN LYMPH NODE (0/1). 3. Breast, simple mastectomy, Right - DUCTAL CARCINOMA IN SITU WITH NECROSIS AND CALCIFICATIONS, 2 CM, RECURRENT. - MARGINS NOT INVOLVED. - PREVIOUS BIOPSY SITE. Microscopic Comment 3. BREAST, IN SITU CARCINOMA Specimen, including laterality: Right breast and two sentinel lymph nodes Procedure (include lymph node sampling sentinel-non-sentinel): Mastectomy and sentinel lymph node biopsies Grade of carcinoma: High grade Necrosis: Present Estimated tumor size: (glass slide measurement): 2 cm Distance to  closest margin: 1.5 cm from posterior margin If margin positive, focally or broadly: N/A Breast prognostic profile: Case [id number] Estrogen receptor: 100%, positive, strong staining Progesterone receptor: 100%, positive, strong staining Lymph nodes: Examined: 2 Sentinel 0 Non-sentinel 2 Total Lymph nodes with metastasis: 0 Isolated tumor cells (< 0.2 mm): 0 Micrometastasis ( > 0.2 mm and < 2.0 mm): 0 Macrometastasis (> 2.0 mm): 0 Extranodal extension: N/A 1 of 2 FINAL for Shular, Melvin W [phone]) Microscopic Comment(continued) TNM: pTis, pN0, recurrent Comments: There is a 2 cm area of high grade ductal carcinoma in situ adjacent to the previous needle core biopsy and there is dense fibrosis consistent with previous lumpectomy site. (JDP:kh 07-10-16)  RADIOGRAPHIC STUDIES: I have personally reviewed the radiological images as listed and agreed with the findings in the report. No results found.  ASSESSMENT & PLAN:  61 y.o. postmenopausal woman, presented with screening discovered DCIS in 04/2016, prior history of right breast DCIS in  2009.  1. Right breast DCIS with necrosis, high-grade DCIS, ER+ /PR+ -I previously discussed her breast imaging and needle biopsy results with patient and her husband in great detail. -This is a local recurrence at the same lumpectomy site from 2009 -Due to her prior breast radiation and the recurrent disease, she underwent right mastectomy and sentinel lymph node biopsy -I previously reviewed her surgical pathology findings, which confirmed high-grade DCIS with 2 courses, surgical margins were negative. Sentinel lymph nodes were negative. -due to her recurrent breast cancer, and family history of multiple cancers, she underwent genetic testing and it was negative  -She has started adjuvant anastrozole, tolerating very well, we'll continue for 5 years. -We again reviewed the potential side effects of anastrozole, especially osteopenia,  osteoporosis and muscular and joint discomfort. -Next mammogram of L breast due in September 2018.  -We again discussed breast cancer surveillance, Including annual mammogram, breast exam every 6-12 months.  2. Bone health -We again discussed her that anastrozole may weaken her bone -I encouraged her to continue calcium and vitamin D supplement, and exercise -Her last scan was in 2014. She will contact  her gynecologist Dr. Orvan Seen to order repeat in one, I recommend repeat a bone density scan every 2 years. -Her PCP checks her vitamin D every year.   Plan -Continue Anastrozole. Refilled today.  -she will have repeated DEXA scan at Dr. Rodena Goldmann office. She will check her vitamin D level at her PCPs office this summer  -I will see her back in 4 months.    All questions were answered. The patient knows to call the clinic with any problems, questions or concerns.  I spent 25 minutes counseling the patient face to face. The total time spent in the appointment was 30 minutes and more than 50% was on counseling.  This document serves as a record of services personally performed by Truitt Merle, MD. It was created on her behalf by Martinique Casey, a trained medical scribe. The creation of this record is based on the scribe's personal observations and the provider's statements to them. This document has been checked and approved by the attending provider.  I have reviewed the above documentation for accuracy and completeness and I agree with the above.   Truitt Merle, MD 11/05/2016

## 2016-11-05 ENCOUNTER — Other Ambulatory Visit (HOSPITAL_BASED_OUTPATIENT_CLINIC_OR_DEPARTMENT_OTHER): Payer: BC Managed Care – PPO

## 2016-11-05 ENCOUNTER — Telehealth: Payer: Self-pay | Admitting: Hematology

## 2016-11-05 ENCOUNTER — Encounter: Payer: Self-pay | Admitting: Hematology

## 2016-11-05 ENCOUNTER — Ambulatory Visit (HOSPITAL_BASED_OUTPATIENT_CLINIC_OR_DEPARTMENT_OTHER): Payer: BC Managed Care – PPO | Admitting: Hematology

## 2016-11-05 VITALS — BP 149/82 | HR 67 | Temp 98.1°F | Resp 18 | Ht 69.0 in | Wt 187.0 lb

## 2016-11-05 DIAGNOSIS — D0511 Intraductal carcinoma in situ of right breast: Secondary | ICD-10-CM

## 2016-11-05 DIAGNOSIS — Z79811 Long term (current) use of aromatase inhibitors: Secondary | ICD-10-CM | POA: Diagnosis not present

## 2016-11-05 DIAGNOSIS — Z170 Estrogen receptor positive status [ER+]: Secondary | ICD-10-CM | POA: Diagnosis not present

## 2016-11-05 DIAGNOSIS — Z86000 Personal history of in-situ neoplasm of breast: Secondary | ICD-10-CM

## 2016-11-05 DIAGNOSIS — Z853 Personal history of malignant neoplasm of breast: Secondary | ICD-10-CM

## 2016-11-05 LAB — COMPREHENSIVE METABOLIC PANEL
ALBUMIN: 3.9 g/dL (ref 3.5–5.0)
ALK PHOS: 64 U/L (ref 40–150)
ALT: 24 U/L (ref 0–55)
ANION GAP: 7 meq/L (ref 3–11)
AST: 27 U/L (ref 5–34)
BILIRUBIN TOTAL: 0.3 mg/dL (ref 0.20–1.20)
BUN: 13 mg/dL (ref 7.0–26.0)
CALCIUM: 10 mg/dL (ref 8.4–10.4)
CO2: 26 mEq/L (ref 22–29)
Chloride: 107 mEq/L (ref 98–109)
Creatinine: 0.8 mg/dL (ref 0.6–1.1)
EGFR: 79 mL/min/{1.73_m2} — AB (ref >90)
Glucose: 88 mg/dl (ref 70–140)
POTASSIUM: 5.1 meq/L (ref 3.5–5.1)
Sodium: 140 mEq/L (ref 136–145)
Total Protein: 7.5 g/dL (ref 6.4–8.3)

## 2016-11-05 LAB — CBC WITH DIFFERENTIAL/PLATELET
BASO%: 0.4 % (ref 0.0–2.0)
Basophils Absolute: 0 10*3/uL (ref 0.0–0.1)
EOS%: 3.6 % (ref 0.0–7.0)
Eosinophils Absolute: 0.2 10*3/uL (ref 0.0–0.5)
HEMATOCRIT: 39.9 % (ref 34.8–46.6)
HEMOGLOBIN: 13 g/dL (ref 11.6–15.9)
LYMPH#: 2.6 10*3/uL (ref 0.9–3.3)
LYMPH%: 46.5 % (ref 14.0–49.7)
MCH: 28.4 pg (ref 25.1–34.0)
MCHC: 32.6 g/dL (ref 31.5–36.0)
MCV: 87.3 fL (ref 79.5–101.0)
MONO#: 0.3 10*3/uL (ref 0.1–0.9)
MONO%: 4.9 % (ref 0.0–14.0)
NEUT#: 2.4 10*3/uL (ref 1.5–6.5)
NEUT%: 44.6 % (ref 38.4–76.8)
PLATELETS: 299 10*3/uL (ref 145–400)
RBC: 4.57 10*6/uL (ref 3.70–5.45)
RDW: 13.7 % (ref 11.2–14.5)
WBC: 5.5 10*3/uL (ref 3.9–10.3)

## 2016-11-05 MED ORDER — ANASTROZOLE 1 MG PO TABS: 1.0000 mg | ORAL_TABLET | Freq: Every day | ORAL | 1 refills | Status: DC

## 2016-11-05 NOTE — Telephone Encounter (Signed)
Gave patient AVS and calender per 11/05/2016 los.  

## 2017-02-19 ENCOUNTER — Other Ambulatory Visit: Payer: Self-pay | Admitting: General Surgery

## 2017-02-19 DIAGNOSIS — D0511 Intraductal carcinoma in situ of right breast: Secondary | ICD-10-CM

## 2017-02-19 DIAGNOSIS — Z853 Personal history of malignant neoplasm of breast: Secondary | ICD-10-CM

## 2017-02-19 DIAGNOSIS — Z9011 Acquired absence of right breast and nipple: Secondary | ICD-10-CM

## 2017-03-11 ENCOUNTER — Ambulatory Visit (HOSPITAL_BASED_OUTPATIENT_CLINIC_OR_DEPARTMENT_OTHER): Payer: BC Managed Care – PPO | Admitting: Hematology

## 2017-03-11 ENCOUNTER — Other Ambulatory Visit (HOSPITAL_BASED_OUTPATIENT_CLINIC_OR_DEPARTMENT_OTHER): Payer: BC Managed Care – PPO

## 2017-03-11 ENCOUNTER — Telehealth: Payer: Self-pay | Admitting: Hematology

## 2017-03-11 ENCOUNTER — Encounter: Payer: Self-pay | Admitting: Hematology

## 2017-03-11 VITALS — BP 133/77 | HR 69 | Temp 98.3°F | Resp 18 | Ht 69.0 in | Wt 189.8 lb

## 2017-03-11 DIAGNOSIS — Z79811 Long term (current) use of aromatase inhibitors: Secondary | ICD-10-CM

## 2017-03-11 DIAGNOSIS — D0511 Intraductal carcinoma in situ of right breast: Secondary | ICD-10-CM | POA: Diagnosis not present

## 2017-03-11 DIAGNOSIS — Z170 Estrogen receptor positive status [ER+]: Secondary | ICD-10-CM

## 2017-03-11 DIAGNOSIS — M25551 Pain in right hip: Secondary | ICD-10-CM

## 2017-03-11 LAB — CBC WITH DIFFERENTIAL/PLATELET
BASO%: 0.6 % (ref 0.0–2.0)
Basophils Absolute: 0 10*3/uL (ref 0.0–0.1)
EOS%: 2.8 % (ref 0.0–7.0)
Eosinophils Absolute: 0.2 10*3/uL (ref 0.0–0.5)
HEMATOCRIT: 40.6 % (ref 34.8–46.6)
HGB: 13.6 g/dL (ref 11.6–15.9)
LYMPH#: 2.7 10*3/uL (ref 0.9–3.3)
LYMPH%: 40.5 % (ref 14.0–49.7)
MCH: 29.2 pg (ref 25.1–34.0)
MCHC: 33.5 g/dL (ref 31.5–36.0)
MCV: 87.1 fL (ref 79.5–101.0)
MONO#: 0.4 10*3/uL (ref 0.1–0.9)
MONO%: 6.1 % (ref 0.0–14.0)
NEUT%: 50 % (ref 38.4–76.8)
NEUTROS ABS: 3.4 10*3/uL (ref 1.5–6.5)
PLATELETS: 297 10*3/uL (ref 145–400)
RBC: 4.66 10*6/uL (ref 3.70–5.45)
RDW: 14.1 % (ref 11.2–14.5)
WBC: 6.7 10*3/uL (ref 3.9–10.3)

## 2017-03-11 LAB — COMPREHENSIVE METABOLIC PANEL
ALBUMIN: 4 g/dL (ref 3.5–5.0)
ALK PHOS: 64 U/L (ref 40–150)
ALT: 18 U/L (ref 0–55)
AST: 27 U/L (ref 5–34)
Anion Gap: 9 mEq/L (ref 3–11)
BILIRUBIN TOTAL: 0.26 mg/dL (ref 0.20–1.20)
BUN: 14.4 mg/dL (ref 7.0–26.0)
CALCIUM: 10 mg/dL (ref 8.4–10.4)
CO2: 25 mEq/L (ref 22–29)
Chloride: 105 mEq/L (ref 98–109)
Creatinine: 0.8 mg/dL (ref 0.6–1.1)
EGFR: 77 mL/min/{1.73_m2} — ABNORMAL LOW (ref >90)
GLUCOSE: 84 mg/dL (ref 70–140)
POTASSIUM: 4.5 meq/L (ref 3.5–5.1)
Sodium: 139 mEq/L (ref 136–145)
TOTAL PROTEIN: 7.7 g/dL (ref 6.4–8.3)

## 2017-03-11 MED ORDER — ANASTROZOLE 1 MG PO TABS
1.0000 mg | ORAL_TABLET | Freq: Every day | ORAL | 1 refills | Status: DC
Start: 2017-03-11 — End: 2017-06-05

## 2017-03-11 NOTE — Progress Notes (Signed)
[place]  [phone] [phone]  Clinic Follow Up Note   Patient Care Team: Kim Pearson, MD as PCP - General (Obstetrics and Gynecology) Kim Seltzer, MD as Consulting Physician (General Surgery) Kim Merle, MD as Consulting Physician (Hematology) 03/11/2017  CHIEF COMPLAINTS:  Follow up recurrent right breast DCIS   Oncology History   Breast cancer of upper-outer quadrant of right female breast [facility])   Staging form: Breast, AJCC 7th Edition   - Clinical stage from 05/15/2016: Stage 0 (Tis (DCIS), N0, M0) - Signed by Kim Merle, MD on 06/18/2016   - Pathologic stage from 07/09/2016: Stage 0 (Tis (DCIS), N0, cM0) - Signed by Kim Merle, MD on 07/30/2016  Cancer of lower-outer quadrant of female breast right [facility] S/P lumpetomy, radiation, declined hormones 04/2008   Staging form: Breast, AJCC 7th Edition   - Clinical stage from 04/21/2008: Stage 0 (Tis (DCIS), N0, M0) - Signed by Kim Merle, MD on 06/18/2016      Cancer of lower-outer quadrant of female breast right [facility] S/P lumpetomy, radiation, declined hormones 04/2008   05/16/2011 Initial Diagnosis    Cancer of lower-outer quadrant of female breast right [facility] S/P lumpetomy, radiation, declined hormones 04/2008       Ductal carcinoma in situ (DCIS) of right breast   04/20/2016 Mammogram    Screening mammogram showed no evidence of malignancy. Expected post lumpectomy change in the right breast.      04/30/2016 Imaging    Bilateral breast MRI with and without contrast showed new enhancement measuring 2.3 x 1.2 x 0.8 cm in the upper outer quadrant of the right lumpectomy site and extending anteriorly, suspicious for recurrent DCIS. Interval increase in number and size of level I, 2 and 3 right axillary lymph node, largest 1 cm.      05/09/2016 Imaging    Ultrasound of the right breast and axilla showed no sonographic abnormality, no suspicious adenopathy by ultrasound. Moderately  prominent lymph nodes may be reactive and related to recent flu vaccine.      05/15/2016 Initial Biopsy    MRI guided right breast core needle biopsy showed DCIS with calcifications, intermediate to high-grade.      05/15/2016 Receptors her2    ER 100% positive, PR 100% positive      05/15/2016 Initial Diagnosis    Breast cancer of upper-outer quadrant of right female breast ([place])      07/09/2016 Surgery    Right mastectomy and SLN biopsy       07/09/2016 Pathology Results    Right mastectomy showed DCIS with necrosis and calcification, high grade,  2 cm, recurrent, margins were negative, 2 sentinel lymph nodes were negative.      09/2016 -  Anti-estrogen oral therapy    Anastrozole 1 mg daily        HISTORY OF PRESENTING ILLNESS:  Kim Webster 61 y.o. female is here because of her recently diagnosed recurrent right breast DCIS. She is accompanied by her husband to my clinic today. She was referred by her surgeon Dr. Excell Webster.   She has a right breast DCIS, status post lumpectomy and are adjuvant radiation in 2009, she was seen by a medical oncologist Dr. Berdie Webster at that time and decided not to pursue chemoprevention due to the limited benefit and concern of side effects. She has been followed by annual screening mammogram since then, her last one was unremarkable in early September 2017. She has been also doing screening breast MRI every 2  years, and she had a repeated MRI on 04/30/2016, which showed a 2.3 cm new enhancement in the upper outer quadrant of right breast, and the previous lumpectomy site. The MRI also showed prominent right axillary lymph nodes. She subsequently underwent ultrasound of the right breast and axilla, which was negative, lymph nodes are slightly prominent, but not suspicious for malignancy. It was felt to be related to her recent flu shot. She underwent MRI guided right breast mass biopsy, which showed DCIS with calcification, intermediate to high-grade, ER and  PR strongly positive.  She feels well, no complains, mild arhtritis in bilateral sounds, no other significant arthralgia. She is a retired Pharmacist, hospital, lives with her husband, remains to be physically active, has good appetite and energy level, no other complaints.  GYN HISTORY  Menarchal: 15 LMP: 77 Contraceptive: 4 years  HRT: none  G3P1: one son 36 yo   CURRENT THERAPY: Anastrozole '1mg'$  daily started 09/2016  INTERIM HISTORY:  Kim Webster returns for follow up. She presents to the clinic today reporting her lab test done at [facility]. She had brought her May labs. Her cholesterol went down.  In April she started having trigger thumb. It comes and goes. Aching in her hips comes and goes. It will be worse in the morning. She is able to tolerate Anastrozole. Overall she is well.     MEDICAL HISTORY:  Past Medical History:  Diagnosis Date  . Arthritis    "mild; thumb joints" (07/09/2016)  . Blood transfusion 1986   "related to ruptured ectopic pregnancy"  . Breast cancer, right breast ([place]) 2009; 06/2016  . Ectopic pregnancy 1986   ruptured  . Family history of breast cancer   . Family history of colon cancer   . Family history of pancreatic cancer   . Family history of prostate cancer   . Hypothyroidism   . Migraine    "~ twice/year" (07/09/2016)  . Pneumonia ~ 2007   "walking pneumonia"  . Thyroid disease    hypothyroism    SURGICAL HISTORY: Past Surgical History:  Procedure Laterality Date  . BREAST BIOPSY Right 2009; 05/2016  . BREAST LUMPECTOMY Right 2009  . ECTOPIC PREGNANCY SURGERY  1986  . MASTECTOMY COMPLETE / SIMPLE W/ SENTINEL NODE BIOPSY Right 07/09/2016  . MASTECTOMY W/ SENTINEL NODE BIOPSY Right 07/09/2016   Procedure: RIGHT BREAST MASTECTOMY WITH RIGHT SENTINEL LYMPH NODE BIOPSY;  Surgeon: Kim Seltzer, MD;  Location: [facility];  Service: General;  Laterality: Right;    SOCIAL HISTORY: Social History   Social History  . Marital status: Married    Spouse  name: N/A  . Number of children: N/A  . Years of education: N/A   Occupational History  . Not on file.   Social History Main Topics  . Smoking status: Never Smoker  . Smokeless tobacco: Never Used  . Alcohol use No  . Drug use: No  . Sexual activity: Yes   Other Topics Concern  . Not on file   Social History Narrative  . No narrative on file    FAMILY HISTORY: Family History  Problem Relation Age of Onset  . Cancer Mother 33       breast cancer  . Cancer Father 17       liver cancer   . Bladder Cancer Father   . Prostate cancer Father   . Cancer Paternal Aunt 60       colon cancer   . Cancer Paternal Grandmother 38  pancreatic cancer     ALLERGIES:  is allergic to vicodin [hydrocodone-acetaminophen].  MEDICATIONS:  Current Outpatient Prescriptions  Medication Sig Dispense Refill  . anastrozole (ARIMIDEX) 1 MG tablet Take 1 tablet (1 mg total) by mouth daily. 90 tablet 1  . Calcium Carbonate-Vitamin D (CALTRATE 600+D PO) Take 1 tablet by mouth daily at 12 noon.     . Calcium-Vitamin D-Vitamin K (VIACTIV) 102-585-27 MG-UNT-MCG CHEW Chew 1 tablet by mouth daily at 12 noon.    Marland Kitchen ibuprofen (MOTRIN IB) 200 MG tablet Take 200 mg by mouth every 8 (eight) hours as needed (for pain.).    Marland Kitchen levothyroxine (SYNTHROID, LEVOTHROID) 88 MCG tablet Take 88 mcg by mouth daily before breakfast.     . mometasone (ELOCON) 0.1 % lotion APPLY TO AFFECTED AREA TWICE A DAY AS NEEDED FOR SORES ON SCALP  6  . Multiple Vitamins-Minerals (CENTRUM SILVER PO) Take 1 tablet by mouth daily at 12 noon.     Marland Kitchen oxyCODONE-acetaminophen (PERCOCET/ROXICET) 5-325 MG tablet Take 1-2 tablets by mouth every 4 (four) hours as needed for moderate pain. 20 tablet 0   No current facility-administered medications for this visit.     REVIEW OF SYSTEMS:   Constitutional: Denies fevers, chills or abnormal night sweats (+) hot flashes (+) sharp pain mastectomy site  Eyes: Denies blurriness of vision, double  vision or watery eyes Ears, nose, mouth, throat, and face: Denies mucositis or sore throat Respiratory: Denies cough, dyspnea or wheezes Cardiovascular: Denies palpitation, chest discomfort or lower extremity swelling Gastrointestinal:  Denies nausea, heartburn or change in bowel habits Skin: Denies abnormal skin rashes Lymphatics: Denies new lymphadenopathy or easy bruising Neurological:Denies numbness, tingling or new weaknesses MSK: (+) Occasional trigger finger (+) occasional hip aches  Behavioral/Psych: Mood is stable, no new changes  All other systems were reviewed with the patient and are negative.  PHYSICAL EXAMINATION:  ECOG PERFORMANCE STATUS: 0 - Asymptomatic  Vitals:   03/11/17 0833  BP: 133/77  Pulse: 69  Resp: 18  Temp: 98.3 F (36.8 C)   Filed Weights   03/11/17 0833  Weight: 189 lb 12.8 oz (86.1 kg)     GENERAL:alert, no distress and comfortable SKIN: skin color, texture, turgor are normal, no rashes or significant lesions EYES: normal, conjunctiva are pink and non-injected, sclera clear OROPHARYNX:no exudate, no erythema and lips, buccal mucosa, and tongue normal  NECK: supple, thyroid normal size, non-tender, without nodularity LYMPH:  no palpable lymphadenopathy in the cervical, axillary or inguinal LUNGS: clear to auscultation and percussion with normal breathing effort HEART: regular rate & rhythm and no murmurs and no lower extremity edema ABDOMEN:abdomen soft, non-tender and normal bowel sounds Musculoskeletal:no cyanosis of digits and no clubbing  PSYCH: alert & oriented x 3 with fluent speech NEURO: no focal motor/sensory deficits Breasts: Status post right mastectomy, surgical incision is healing well, no skin erythema or discharge. Palpation of the left breast and axilla revealed no obvious mass that I could appreciate. Left breast normal.   LABORATORY DATA:  I have reviewed the data as listed CBC Latest Ref Rng & Units 03/11/2017 11/05/2016  07/30/2016  WBC 3.9 - 10.3 10e3/uL 6.7 5.5 6.1  Hemoglobin 11.6 - 15.9 g/dL 13.6 13.0 12.8  Hematocrit 34.8 - 46.6 % 40.6 39.9 39.2  Platelets 145 - 400 10e3/uL 297 299 355   CMP Latest Ref Rng & Units 03/11/2017 11/05/2016 07/30/2016  Glucose 70 - 140 mg/dl 84 88 92  BUN 7.0 - 26.0 mg/dL 14.4 13.0 14.7  Creatinine 0.6 - 1.1 mg/dL 0.8 0.8 0.8  Sodium 136 - 145 mEq/L 139 140 140  Potassium 3.5 - 5.1 mEq/L 4.5 5.1 4.2  Chloride 96 - 112 mEq/L - - -  CO2 22 - 29 mEq/L '25 26 25  '$ Calcium 8.4 - 10.4 mg/dL 10.0 10.0 10.0  Total Protein 6.4 - 8.3 g/dL 7.7 7.5 7.5  Total Bilirubin 0.20 - 1.20 mg/dL 0.26 0.30 0.44  Alkaline Phos 40 - 150 U/L 64 64 72  AST 5 - 34 U/L 27 27 33  ALT 0 - 55 U/L 18 24 39   PATHOLOGY REPORT  Diagnosis 05/15/2016 Breast, right, needle core biopsy, lateral - DUCTAL CARCINOMA IN SITU WITH CALCIFICATIONS. - FIBROCYSTIC CHANGES WITH ADENOSIS AND CALCIFICATIONS. - SEE COMMENT. The carcinoma appears intermediate to high grade. Estrogen receptor and progesterone receptor studies will be performed and the results reported separately. The results were called to [facility] on 05/16/16. (JBK:gt, 05/16/16) Results: IMMUNOHISTOCHEMICAL AND MORPHOMETRIC ANALYSIS PERFORMED MANUALLY Estrogen Receptor: 100%, POSITIVE, STRONG STAINING INTENSITY Progesterone Receptor: 100%, POSITIVE, STRONG STAINING INTENSITY  Diagnosis 07/09/2016 1. Lymph node, sentinel, biopsy, Right axillary #1 - ONE BENIGN LYMPH NODE (0/1). 2. Lymph node, sentinel, biopsy, Right axillary #2 - ONE BENIGN LYMPH NODE (0/1). 3. Breast, simple mastectomy, Right - DUCTAL CARCINOMA IN SITU WITH NECROSIS AND CALCIFICATIONS, 2 CM, RECURRENT. - MARGINS NOT INVOLVED. - PREVIOUS BIOPSY SITE. Microscopic Comment 3. BREAST, IN SITU CARCINOMA Specimen, including laterality: Right breast and two sentinel lymph nodes Procedure (include lymph node sampling sentinel-non-sentinel): Mastectomy and sentinel  lymph node biopsies Grade of carcinoma: High grade Necrosis: Present Estimated tumor size: (glass slide measurement): 2 cm Distance to closest margin: 1.5 cm from posterior margin If margin positive, focally or broadly: N/A Breast prognostic profile: Case [id number] Estrogen receptor: 100%, positive, strong staining Progesterone receptor: 100%, positive, strong staining Lymph nodes: Examined: 2 Sentinel 0 Non-sentinel 2 Total Lymph nodes with metastasis: 0 Isolated tumor cells (< 0.2 mm): 0 Micrometastasis ( > 0.2 mm and < 2.0 mm): 0 Macrometastasis (> 2.0 mm): 0 Extranodal extension: N/A 1 of 2 FINAL for Bubel, [place] W [phone]) Microscopic Comment(continued) TNM: pTis, pN0, recurrent Comments: There is a 2 cm area of high grade ductal carcinoma in situ adjacent to the previous needle core biopsy and there is dense fibrosis consistent with previous lumpectomy site. (JDP:kh 07-10-16)  RADIOGRAPHIC STUDIES:  I have personally reviewed the radiological images as listed and agreed with the findings in the report. No results found.  Bone Density 01/30/17 AP Spine t-score -0.6 Femoral Neck Left T-score 0.4 Femoral Neck Right T-score 1.0 Bone scan normal    Bone Density  04/08/13 AP Spine T-score -0.5 Femoral Neck Left T-score 0.6 Femoral Neck Right 0.7 Bone scan normal    ASSESSMENT & PLAN:  61 y.o. postmenopausal woman, presented with screening discovered DCIS in 04/2016, prior history of right breast DCIS in  2009.  1. Right breast DCIS with necrosis, high-grade DCIS, ER+ /PR+ -I previously discussed her breast imaging and needle biopsy results with patient and her husband in great detail. -This is a local recurrence at the same lumpectomy site from 2009 -Due to her prior breast radiation and the recurrent disease, she underwent right mastectomy and sentinel lymph node biopsy -I previously reviewed her surgical pathology findings, which confirmed high-grade DCIS with  2 courses, surgical margins were negative. Sentinel lymph nodes were negative. -due to her recurrent breast cancer, and family history of multiple cancers, she  underwent genetic testing and it was negative  -She has started adjuvant anastrozole, tolerating very well, we'll continue for 5 years. -We again reviewed the potential side effects of anastrozole, especially osteopenia, osteoporosis and muscular and joint discomfort. -We again discussed breast cancer surveillance, Including annual mammogram, breast exam every 6-12 months. -She is clinically doing well. May Labs and today's labs reviewed and her vitamin D is normal and her cholesterol has improved. Her Breast exam and Bone density is normal.  -Pt is Tolerating Anastrozole well, does have mild arthralgia, we'll continue for now. We discussed the option of switching to tamoxifen if arthralgia gets worse. -Next mammogram is 04/2017 is scheduled    2. Bone health -We again discussed her that anastrozole may weaken her bone -I encouraged her to continue calcium and vitamin D supplement, and exercise -Her PCP checks her vitamin D every year, it was normal in 12/2016 -Bone Density Scan from 01/30/17 is normal. Will repeat in 2020  3. Arthralgia -She has a mild joint pain and stiffness in the right hip, and new trigger finger on right thumb  -We discussed potential arthralgia from letrozole, I encouraged her to exercise regularly, and stay active. -We'll continue monitoring. -We discussed her the option of switching her letrozole to tamoxifen if her arthralgia gets worse.  Plan  -Continue Anastrozole. Refilled today.  -I will see her back in 6 months with labs    All questions were answered. The patient knows to call the clinic with any problems, questions or concerns.  I spent 25 minutes counseling the patient face to face. The total time spent in the appointment was 30 minutes and more than 50% was on counseling.  This document serves as  a record of services personally performed by Kim Merle, MD. It was created on her behalf by Joslyn Devon, a trained medical scribe. The creation of this record is based on the scribe's personal observations and the provider's statements to them. This document has been checked and approved by the attending provider.   I have reviewed the above documentation for accuracy and completeness and I agree with the above.   Kim Merle, MD  03/11/2017

## 2017-03-11 NOTE — Telephone Encounter (Signed)
Gave patient avs report and appointments for January  °

## 2017-03-14 ENCOUNTER — Encounter: Payer: BC Managed Care – PPO | Admitting: Adult Health

## 2017-04-22 ENCOUNTER — Ambulatory Visit
Admission: RE | Admit: 2017-04-22 | Discharge: 2017-04-22 | Disposition: A | Discharge status: Home / Self Care | Payer: BC Managed Care – PPO | Source: Ambulatory Visit | Attending: General Surgery | Admitting: General Surgery

## 2017-04-22 DIAGNOSIS — Z9011 Acquired absence of right breast and nipple: Secondary | ICD-10-CM

## 2017-04-22 DIAGNOSIS — Z853 Personal history of malignant neoplasm of breast: Secondary | ICD-10-CM

## 2017-04-26 ENCOUNTER — Encounter: Payer: BC Managed Care – PPO | Admitting: Adult Health

## 2017-04-29 ENCOUNTER — Ambulatory Visit
Admission: RE | Admit: 2017-04-29 | Discharge: 2017-04-29 | Disposition: A | Discharge status: Home / Self Care | Payer: BC Managed Care – PPO | Source: Ambulatory Visit | Attending: General Surgery | Admitting: General Surgery

## 2017-04-29 DIAGNOSIS — D0511 Intraductal carcinoma in situ of right breast: Secondary | ICD-10-CM

## 2017-04-29 MED ORDER — GADOBENATE DIMEGLUMINE 529 MG/ML IV SOLN
18.0000 mL | Freq: Once | INTRAVENOUS | Status: AC | PRN
  Administered 2017-04-29: 18 mL via INTRAVENOUS

## 2017-05-08 ENCOUNTER — Telehealth: Payer: Self-pay

## 2017-05-08 NOTE — Telephone Encounter (Signed)
Called patient to confirm SCP visit appt on 05/14/17.  Patient states she will be here.

## 2017-05-14 ENCOUNTER — Ambulatory Visit (HOSPITAL_BASED_OUTPATIENT_CLINIC_OR_DEPARTMENT_OTHER): Payer: BC Managed Care – PPO | Admitting: Adult Health

## 2017-05-14 VITALS — BP 133/77 | HR 70 | Temp 98.3°F | Resp 17 | Ht 69.0 in | Wt 191.5 lb

## 2017-05-14 DIAGNOSIS — Z79811 Long term (current) use of aromatase inhibitors: Secondary | ICD-10-CM | POA: Diagnosis not present

## 2017-05-14 DIAGNOSIS — D0511 Intraductal carcinoma in situ of right breast: Secondary | ICD-10-CM

## 2017-05-14 NOTE — Progress Notes (Signed)
CLINIC:  Survivorship   REASON FOR VISIT:  Routine follow-up post-treatment for a recent history of breast cancer.  BRIEF ONCOLOGIC HISTORY:  Oncology History   Breast cancer of upper-outer quadrant of right female breast [facility])   Staging form: Breast, AJCC 7th Edition   - Clinical stage from 05/15/2016: Stage 0 (Tis (DCIS), N0, M0) - Signed by Truitt Merle, MD on 06/18/2016   - Pathologic stage from 07/09/2016: Stage 0 (Tis (DCIS), N0, cM0) - Signed by Truitt Merle, MD on 07/30/2016  Cancer of lower-outer quadrant of female breast right [facility]is[place] S/P lumpetomy, radiation, declined hormones 04/2008   Staging form: Breast, AJCC 7th Edition   - Clinical stage from 04/21/2008: Stage 0 (Tis (DCIS), N0, M0) - Signed by Truitt Merle, MD on 06/18/2016      Cancer of lower-outer quadrant of female breast right TisNoMo S/P lumpetomy, radiation, declined hormones 04/2008   05/16/2011 Initial Diagnosis    Cancer of lower-outer quadrant of female breast right [facility]isNoMo S/P lumpetomy, radiation, declined hormones 04/2008       Ductal carcinoma in situ (DCIS) of right breast   04/20/2016 Mammogram    Screening mammogram showed no evidence of malignancy. Expected post lumpectomy change in the right breast.      04/30/2016 Imaging    Bilateral breast MRI with and without contrast showed new enhancement measuring 2.3 x 1.2 x 0.8 cm in the upper outer quadrant of the right lumpectomy site and extending anteriorly, suspicious for recurrent DCIS. Interval increase in number and size of level I, 2 and 3 right axillary lymph node, largest 1 cm.      05/09/2016 Imaging    Ultrasound of the right breast and axilla showed no sonographic abnormality, no suspicious adenopathy by ultrasound. Moderately prominent lymph nodes may be reactive and related to recent flu vaccine.      05/15/2016 Initial Biopsy    MRI guided right breast core needle biopsy showed DCIS with calcifications, intermediate to high-grade.      05/15/2016  Receptors her2    ER 100% positive, PR 100% positive      05/15/2016 Initial Diagnosis    Breast cancer of upper-outer quadrant of right female breast ([place])      07/09/2016 Surgery    Right mastectomy and SLN biopsy       07/09/2016 Pathology Results    Right mastectomy showed DCIS with necrosis and calcification, high grade,  2 cm, recurrent, margins were negative, 2 sentinel lymph nodes were negative.      09/2016 -  Anti-estrogen oral therapy    Anastrozole 1 mg daily        INTERVAL HISTORY:  Ms. Langbehn presents to the [facility] today for our initial meeting to review her survivorship care plan detailing her treatment course for breast cancer, as well as monitoring long-term side effects of that treatment, education regarding health maintenance, screening, and overall wellness and health promotion.     Overall, Ms. Kulkarni reports feeling quite well.  She is taking anastrozole daily and sometimes worries about possible side effects.  She has right trigger thumb, and hasn't seen anyone yet at this point.  Otherwise she is doing well and is without questions and concerns today.    REVIEW OF SYSTEMS:  Review of Systems  Constitutional: Negative for appetite change, chills, diaphoresis, fatigue and fever.  HENT:   Negative for hearing loss and lump/mass.   Eyes: Negative for eye problems and icterus.  Respiratory: Negative for chest tightness,  cough and shortness of breath.   Cardiovascular: Negative for chest pain, leg swelling and palpitations.  Gastrointestinal: Negative for abdominal distention, abdominal pain, constipation, diarrhea, nausea and vomiting.  Endocrine: Negative for hot flashes.  Skin: Negative for rash.  Neurological: Negative for dizziness, extremity weakness, headaches and numbness.  Hematological: Negative for adenopathy. Does not bruise/bleed easily.  Psychiatric/Behavioral: Negative for depression. The patient is not nervous/anxious.   Breast:  Denies any new nodularity, masses, tenderness, nipple changes, or nipple discharge.      ONCOLOGY TREATMENT TEAM:  1. Surgeon:  Dr. Excell Seltzer at [facility] 2. Medical Oncologist: Dr. Burr Medico     PAST MEDICAL/SURGICAL HISTORY:  Past Medical History:  Diagnosis Date  . Arthritis    "mild; thumb joints" (07/09/2016)  . Blood transfusion 1986   "related to ruptured ectopic pregnancy"  . Breast cancer, right breast ([place]) 2009; 06/2016  . Ectopic pregnancy 1986   ruptured  . Family history of breast cancer   . Family history of colon cancer   . Family history of pancreatic cancer   . Family history of prostate cancer   . Hypothyroidism   . Migraine    "~ twice/year" (07/09/2016)  . Pneumonia ~ 2007   "walking pneumonia"  . Thyroid disease    hypothyroism   Past Surgical History:  Procedure Laterality Date  . BREAST BIOPSY Right 2009; 05/2016  . BREAST LUMPECTOMY Right 2009  . ECTOPIC PREGNANCY SURGERY  1986  . MASTECTOMY COMPLETE / SIMPLE W/ SENTINEL NODE BIOPSY Right 07/09/2016  . MASTECTOMY W/ SENTINEL NODE BIOPSY Right 07/09/2016   Procedure: RIGHT BREAST MASTECTOMY WITH RIGHT SENTINEL LYMPH NODE BIOPSY;  Surgeon: Excell Seltzer, MD;  Location: [facility];  Service: General;  Laterality: Right;     ALLERGIES:  Allergies  Allergen Reactions  . Vicodin [Hydrocodone-Acetaminophen] Nausea And Vomiting     CURRENT MEDICATIONS:  Outpatient Encounter Prescriptions as of 05/14/2017  Medication Sig  . anastrozole (ARIMIDEX) 1 MG tablet Take 1 tablet (1 mg total) by mouth daily.  . Calcium Carbonate-Vitamin D (CALTRATE 600+D PO) Take 1 tablet by mouth daily at 12 noon.   . Calcium-Vitamin D-Vitamin K (VIACTIV) 983-382-50 MG-UNT-MCG CHEW Chew 1 tablet by mouth daily at 12 noon.  Marland Kitchen ibuprofen (MOTRIN IB) 200 MG tablet Take 200 mg by mouth every 8 (eight) hours as needed (for pain.).  Marland Kitchen levothyroxine (SYNTHROID, LEVOTHROID) 88 MCG tablet Take 88 mcg by mouth daily  before breakfast.   . mometasone (ELOCON) 0.1 % lotion APPLY TO AFFECTED AREA TWICE A DAY AS NEEDED FOR SORES ON SCALP  . Multiple Vitamins-Minerals (CENTRUM SILVER PO) Take 1 tablet by mouth daily at 12 noon.   . [DISCONTINUED] oxyCODONE-acetaminophen (PERCOCET/ROXICET) 5-325 MG tablet Take 1-2 tablets by mouth every 4 (four) hours as needed for moderate pain.   No facility-administered encounter medications on file as of 05/14/2017.      ONCOLOGIC FAMILY HISTORY:  Family History  Problem Relation Age of Onset  . Cancer Mother 73       breast cancer  . Breast cancer Mother   . Cancer Father 38       liver cancer   . Bladder Cancer Father   . Prostate cancer Father   . Cancer Paternal Aunt 8       colon cancer   . Cancer Paternal Grandmother 29       pancreatic cancer      GENETIC COUNSELING/TESTING: See genetic notes, done and normal  SOCIAL HISTORY:  Coralie Stanke Nachtigal is married and lives with her husband in [place], [place].  She has 1 child and they live in [place].  Ms. 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  She denies any current or history of tobacco, alcohol, or illicit drug use.     PHYSICAL EXAMINATION:  Vital Signs:   Vitals:   05/14/17 1351  BP: 133/77  Pulse: 70  Resp: 17  Temp: 98.3 F (36.8 C)  SpO2: 98%   Filed Weights   05/14/17 1351  Weight: 191 lb 8 oz (86.9 kg)   General: Well-nourished, well-appearing female in no acute distress.  She is unaccompanied today.   HEENT: Head is normocephalic.  Pupils equal and reactive to light. Conjunctivae clear without exudate.  Sclerae anicteric. Oral mucosa is pink, moist.  Oropharynx is pink without lesions or erythema.  Lymph: No cervical, supraclavicular, or infraclavicular lymphadenopathy noted on palpation.  Cardiovascular: Regular rate and rhythm.Marland Kitchen Respiratory: Clear to auscultation bilaterally. Chest expansion symmetric; breathing non-labored.  GI:  Abdomen soft and round; non-tender, non-distended. Bowel sounds normoactive.  GU: Deferred.  Neuro: No focal deficits. Steady gait.  Psych: Mood and affect normal and appropriate for situation.  Extremities: No edema. MSK: No focal spinal tenderness to palpation.  Full range of motion in bilateral upper extremities Skin: Warm and dry.  LABORATORY DATA:  None for this visit.  DIAGNOSTIC IMAGING:  None for this visit.      ASSESSMENT AND PLAN:  Ms.. Rosch is a pleasant 61 y.o. female with Stage 0 right breast DCIS, ER+/PR+, diagnosed in 04/2016, treated with right mastectomy, and anti-estrogen therapy with Anastrozole beginning in 09/2016.  She presents to the Survivorship Clinic for our initial meeting and routine follow-up post-completion of treatment for breast cancer.    1. Stage 0 right breast cancer:  Ms. Demario is continuing to recover from definitive treatment for breast cancer. She will follow-up with her medical oncologist, Dr. Burr Medico in 08/2017 with history and physical exam per surveillance protocol.  She will continue her anti-estrogen therapy with Anastorozle. Thus far, she is tolerating the Anastrozole well, with minimal side effects.  Patient has been undergoing screening with MRI due to breast cancer risk.  When she was recently diagnosed in 2017, the mammogram was normal, however the screening mammogram detected her DCIS.  She was having every other year MRIs, they have since changed to annual.  Today, a comprehensive survivorship care plan and treatment summary was reviewed with the patient today detailing her breast cancer diagnosis, treatment course, potential late/long-term effects of treatment, appropriate follow-up care with recommendations for the future, and patient education resources.  A copy of this summary, along with a letter will be sent to the patient's primary care provider via mail/fax/In Basket message after today's visit.    2. Bone health:  Given Ms. Huesca's  age/history of breast cancer and her current treatment regimen including anti-estrogen therapy with Anastrozole, she is at risk for bone demineralization.  Her last DEXA scan was over the summer which was normal.   In the meantime, she was encouraged to increase her consumption of foods rich in calcium, as well as increase her weight-bearing activities.  She was given education on specific activities to promote bone health.  3. Cancer screening:  Due to Ms. [place] history and her age, she should receive screening for skin cancers, colon cancer, and gynecologic cancers.  The information and recommendations are listed on the  patient's comprehensive care plan/treatment summary and were reviewed in detail with the patient.    4. Health maintenance and wellness promotion: Ms. Clayton was encouraged to consume 5-7 servings of fruits and vegetables per day. We reviewed the "Nutrition Rainbow" handout, as well as the handout "Take Control of Your Health and Reduce Your Cancer Risk" from [facility].  She was also encouraged to engage in moderate to vigorous exercise for 30 minutes per day most days of the week. We discussed the [employer] fitness program, which is designed for cancer survivors to help them become more physically fit after cancer treatments.  She was instructed to limit her alcohol consumption and continue to abstain from tobacco use.     5. Support services/counseling: It is not uncommon for this period of the patient's cancer care trajectory to be one of many emotions and stressors.  We discussed an opportunity for her to participate in the next session of [facility] ("Finding Your New Normal") support group series designed for patients after they have completed treatment.   Ms. Knowles was encouraged to take advantage of our many other support services programs, support groups, and/or counseling in coping with her new life as a cancer survivor after completing anti-cancer treatment.  She  was offered support today through active listening and expressive supportive counseling.  She was given information regarding our available services and encouraged to contact me with any questions or for help enrolling in any of our support group/programs.    Dispo:   -Return to cancer center in 08/2017 for follow up with Dr. Burr Medico -Mammogram 04/2018 -MRI breast 04/2018 -Follow up with Dr. Excell Seltzer at [facility] in 07/2017 -She is welcome to return back to the Survivorship Clinic at any time; no additional follow-up needed at this time.  -Consider referral back to survivorship as a long-term survivor for continued surveillance  A total of (30) minutes of face-to-face time was spent with this patient with greater than 50% of that time in counseling and care-coordination.   Gardenia Phlegm, NP Survivorship Program [place] [phone]   Note: PRIMARY CARE PROVIDER Marylynn Pearson, [place] [phone]

## 2017-05-15 ENCOUNTER — Encounter: Payer: Self-pay | Admitting: Adult Health

## 2017-06-05 ENCOUNTER — Other Ambulatory Visit: Payer: Self-pay

## 2017-06-05 MED ORDER — ANASTROZOLE 1 MG PO TABS
1.0000 mg | ORAL_TABLET | Freq: Every day | ORAL | 1 refills | Status: DC
Start: 2017-06-05 — End: 2017-09-09

## 2017-09-04 NOTE — Progress Notes (Signed)
[facility]  [phone] Fax:([phone]) [phone]  Clinic Follow Up Note   Patient Care Team: Marylynn Pearson, MD as PCP - General (Obstetrics and Gynecology) Excell Seltzer, MD as Consulting Physician (General Surgery) Truitt Merle, MD as Consulting Physician (Hematology) Gardenia Phlegm, NP as Nurse Practitioner (Hematology and Oncology) 09/09/2017  CHIEF COMPLAINTS:  Follow up recurrent right breast DCIS   Oncology History   Breast cancer of upper-outer quadrant of right female breast [facility])   Staging form: Breast, AJCC 7th Edition   - Clinical stage from 05/15/2016: Stage 0 (Tis (DCIS), N0, M0) - Signed by Truitt Merle, MD on 06/18/2016   - Pathologic stage from 07/09/2016: Stage 0 (Tis (DCIS), N0, cM0) - Signed by Truitt Merle, MD on 07/30/2016  Cancer of lower-outer quadrant of female breast right [facility]is[facility] S/P lumpetomy, radiation, declined hormones 04/2008   Staging form: Breast, AJCC 7th Edition   - Clinical stage from 04/21/2008: Stage 0 (Tis (DCIS), N0, M0) - Signed by Truitt Merle, MD on 06/18/2016      Cancer of lower-outer quadrant of female breast right TisNoMo S/P lumpetomy, radiation, declined hormones 04/2008   05/16/2011 Initial Diagnosis    Cancer of lower-outer quadrant of female breast right TisNoMo S/P lumpetomy, radiation, declined hormones 04/2008       Ductal carcinoma in situ (DCIS) of right breast   04/20/2016 Mammogram    Screening mammogram showed no evidence of malignancy. Expected post lumpectomy change in the right breast.      04/30/2016 Imaging    Bilateral breast MRI with and without contrast showed new enhancement measuring 2.3 x 1.2 x 0.8 cm in the upper outer quadrant of the right lumpectomy site and extending anteriorly, suspicious for recurrent DCIS. Interval increase in number and size of level I, 2 and 3 right axillary lymph node, largest 1 cm.      05/09/2016 Imaging    Ultrasound of the right breast and axilla showed no  sonographic abnormality, no suspicious adenopathy by ultrasound. Moderately prominent lymph nodes may be reactive and related to recent flu vaccine.      05/15/2016 Initial Biopsy    MRI guided right breast core needle biopsy showed DCIS with calcifications, intermediate to high-grade.      05/15/2016 Receptors her2    ER 100% positive, PR 100% positive      05/15/2016 Initial Diagnosis    Breast cancer of upper-outer quadrant of right female breast ([place])      07/09/2016 Surgery    Right mastectomy and SLN biopsy       07/09/2016 Pathology Results    Right mastectomy showed DCIS with necrosis and calcification, high grade,  2 cm, recurrent, margins were negative, 2 sentinel lymph nodes were negative.      09/2016 -  Anti-estrogen oral therapy    Anastrozole 1 mg daily       04/29/2017 Mammogram    IMPRESSION: No suspicious areas of enhancement identified within the left breast.       HISTORY OF PRESENTING ILLNESS:  Kim Webster 62 y.o. female is here because of her recently diagnosed recurrent right breast DCIS. She is accompanied by her husband to my clinic today. She was referred by her surgeon Dr. Excell Seltzer.   She has a right breast DCIS, status post lumpectomy and are adjuvant radiation in 2009, she was seen by a medical oncologist Dr. Berdie Ogren at that time and decided not to pursue chemoprevention due to the limited benefit and concern of  side effects. She has been followed by annual screening mammogram since then, her last one was unremarkable in early September 2017. She has been also doing screening breast MRI every 2 years, and she had a repeated MRI on 04/30/2016, which showed a 2.3 cm new enhancement in the upper outer quadrant of right breast, and the previous lumpectomy site. The MRI also showed prominent right axillary lymph nodes. She subsequently underwent ultrasound of the right breast and axilla, which was negative, lymph nodes are slightly prominent, but not  suspicious for malignancy. It was felt to be related to her recent flu shot. She underwent MRI guided right breast mass biopsy, which showed DCIS with calcification, intermediate to high-grade, ER and PR strongly positive.  She feels well, no complains, mild arhtritis in bilateral sounds, no other significant arthralgia. She is a retired Pharmacist, hospital, lives with her husband, remains to be physically active, has good appetite and energy level, no other complaints.  GYN HISTORY  Menarchal: 15 LMP: 52 Contraceptive: 4 years  HRT: none  G3P1: one son 12 yo   CURRENT THERAPY: Anastrozole '1mg'$  daily started 09/2016  INTERIM HISTORY:  ELLISSA AYO returns for follow up. She reports that she is doing well overall. She still has mild to moderate arthralgia that is bearable. She is compliant with Anastrozole and her only complaint is intermittent stiffness in her thumbs. She last saw Dr. Excell Seltzer in Dec/2018 and she will continue to follow up. She explains that she does routinely examine her breast herself. She still has some numbness around her incision area.   On review of systems, pt denies new pain, or any other complaints at this time. Pertinent positives are listed and detailed within the above HPI.    MEDICAL HISTORY:  Past Medical History:  Diagnosis Date  . Arthritis    "mild; thumb joints" (07/09/2016)  . Blood transfusion 1986   "related to ruptured ectopic pregnancy"  . Breast cancer, right breast ([place]) 2009; 06/2016  . Ectopic pregnancy 1986   ruptured  . Family history of breast cancer   . Family history of colon cancer   . Family history of pancreatic cancer   . Family history of prostate cancer   . Hypothyroidism   . Migraine    "~ twice/year" (07/09/2016)  . Pneumonia ~ 2007   "walking pneumonia"  . Thyroid disease    hypothyroism    SURGICAL HISTORY: Past Surgical History:  Procedure Laterality Date  . BREAST BIOPSY Right 2009; 05/2016  . BREAST LUMPECTOMY Right 2009   . ECTOPIC PREGNANCY SURGERY  1986  . MASTECTOMY COMPLETE / SIMPLE W/ SENTINEL NODE BIOPSY Right 07/09/2016  . MASTECTOMY W/ SENTINEL NODE BIOPSY Right 07/09/2016   Procedure: RIGHT BREAST MASTECTOMY WITH RIGHT SENTINEL LYMPH NODE BIOPSY;  Surgeon: Excell Seltzer, MD;  Location: [facility];  Service: General;  Laterality: Right;    SOCIAL HISTORY: Social History   Socioeconomic History  . Marital status: Married    Spouse name: Not on file  . Number of children: Not on file  . Years of education: Not on file  . Highest education level: Not on file  Social Needs  . Financial resource strain: Not on file  . Food insecurity - worry: Not on file  . Food insecurity - inability: Not on file  . Transportation needs - medical: Not on file  . Transportation needs - non-medical: Not on file  Occupational History  . Not on file  Tobacco Use  . Smoking status:  Never Smoker  . Smokeless tobacco: Never Used  Substance and Sexual Activity  . Alcohol use: No  . Drug use: No  . Sexual activity: Yes  Other Topics Concern  . Not on file  Social History Narrative  . Not on file    FAMILY HISTORY: Family History  Problem Relation Age of Onset  . Cancer Mother 69       breast cancer  . Breast cancer Mother   . Cancer Father 52       liver cancer   . Bladder Cancer Father   . Prostate cancer Father   . Cancer Paternal Aunt 62       colon cancer   . Cancer Paternal Grandmother 37       pancreatic cancer     ALLERGIES:  is allergic to vicodin [hydrocodone-acetaminophen].  MEDICATIONS:  Current Outpatient Medications  Medication Sig Dispense Refill  . anastrozole (ARIMIDEX) 1 MG tablet Take 1 tablet (1 mg total) by mouth daily. 90 tablet 1  . Calcium Carbonate-Vitamin D (CALTRATE 600+D PO) Take 1 tablet by mouth daily at 12 noon.     . Calcium-Vitamin D-Vitamin K (VIACTIV) 841-324-40 MG-UNT-MCG CHEW Chew 1 tablet by mouth daily at 12 noon.    Marland Kitchen ibuprofen (MOTRIN IB) 200 MG tablet  Take 200 mg by mouth every 8 (eight) hours as needed (for pain.).    Marland Kitchen levothyroxine (SYNTHROID, LEVOTHROID) 88 MCG tablet Take 88 mcg by mouth daily before breakfast.     . mometasone (ELOCON) 0.1 % lotion APPLY TO AFFECTED AREA TWICE A DAY AS NEEDED FOR SORES ON SCALP  6  . Multiple Vitamins-Minerals (CENTRUM SILVER PO) Take 1 tablet by mouth daily at 12 noon.      No current facility-administered medications for this visit.     REVIEW OF SYSTEMS:   Constitutional: Denies fevers, chills or abnormal night sweats (+) hot flashes (+) sharp pain mastectomy site  Eyes: Denies blurriness of vision, double vision or watery eyes Ears, nose, mouth, throat, and face: Denies mucositis or sore throat Respiratory: Denies cough, dyspnea or wheezes Cardiovascular: Denies palpitation, chest discomfort or lower extremity swelling Gastrointestinal:  Denies nausea, heartburn or change in bowel habits Skin: Denies abnormal skin rashes Lymphatics: Denies new lymphadenopathy or easy bruising Neurological:Denies numbness, tingling or new weaknesses MSK: (+) Occasional trigger finger (+) occasional hip aches  Behavioral/Psych: Mood is stable, no new changes  All other systems were reviewed with the patient and are negative.  PHYSICAL EXAMINATION:  ECOG PERFORMANCE STATUS: 0 - Asymptomatic  Vitals:   09/09/17 0843  BP: (!) 143/74  Pulse: 70  Resp: 18  Temp: 98.4 F (36.9 C)  SpO2: 100%   Filed Weights   09/09/17 0843  Weight: 196 lb 1.6 oz (89 kg)     GENERAL:alert, no distress and comfortable SKIN: skin color, texture, turgor are normal, no rashes or significant lesions EYES: normal, conjunctiva are pink and non-injected, sclera clear OROPHARYNX:no exudate, no erythema and lips, buccal mucosa, and tongue normal  NECK: supple, thyroid normal size, non-tender, without nodularity LYMPH:  no palpable lymphadenopathy in the cervical, axillary or inguinal LUNGS: clear to auscultation and percussion  with normal breathing effort HEART: regular rate & rhythm and no murmurs and no lower extremity edema ABDOMEN:abdomen soft, non-tender and normal bowel sounds Musculoskeletal:no cyanosis of digits and no clubbing  PSYCH: alert & oriented x 3 with fluent speech NEURO: no focal motor/sensory deficits Breasts: Status post right mastectomy, surgical incision is healing  well, no skin erythema or discharge. Palpation of the left breast and axilla revealed no obvious mass that I could appreciate. Left breast normal.   LABORATORY DATA:  I have reviewed the data as listed CBC Latest Ref Rng & Units 09/09/2017 03/11/2017 11/05/2016  WBC 3.9 - 10.3 K/uL 5.8 6.7 5.5  Hemoglobin 11.6 - 15.9 g/dL 13.0 13.6 13.0  Hematocrit 34.8 - 46.6 % 40.2 40.6 39.9  Platelets 145 - 400 K/uL 316 297 299   CMP Latest Ref Rng & Units 09/09/2017 03/11/2017 11/05/2016  Glucose 70 - 140 mg/dL 84 84 88  BUN 7 - 26 mg/dL 14 14.4 13.0  Creatinine 0.60 - 1.10 mg/dL 0.81 0.8 0.8  Sodium 136 - 145 mmol/L 141 139 140  Potassium 3.3 - 4.7 mmol/L 4.5 4.5 5.1  Chloride 98 - 109 mmol/L 106 - -  CO2 22 - 29 mmol/L '29 25 26  '$ Calcium 8.4 - 10.4 mg/dL 9.6 10.0 10.0  Total Protein 6.4 - 8.3 g/dL 7.6 7.7 7.5  Total Bilirubin 0.2 - 1.2 mg/dL 0.2 0.26 0.30  Alkaline Phos 40 - 150 U/L 63 64 64  AST 5 - 34 U/L '20 27 27  '$ ALT 0 - 55 U/L '17 18 24   '$ PATHOLOGY REPORT  Diagnosis 05/15/2016 Breast, right, needle core biopsy, lateral - DUCTAL CARCINOMA IN SITU WITH CALCIFICATIONS. - FIBROCYSTIC CHANGES WITH ADENOSIS AND CALCIFICATIONS. - SEE COMMENT. The carcinoma appears intermediate to high grade. Estrogen receptor and progesterone receptor studies will be performed and the results reported separately. The results were called to [facility] on 05/16/16. (JBK:gt, 05/16/16) Results: IMMUNOHISTOCHEMICAL AND MORPHOMETRIC ANALYSIS PERFORMED MANUALLY Estrogen Receptor: 100%, POSITIVE, STRONG STAINING INTENSITY Progesterone  Receptor: 100%, POSITIVE, STRONG STAINING INTENSITY  Diagnosis 07/09/2016 1. Lymph node, sentinel, biopsy, Right axillary #1 - ONE BENIGN LYMPH NODE (0/1). 2. Lymph node, sentinel, biopsy, Right axillary #2 - ONE BENIGN LYMPH NODE (0/1). 3. Breast, simple mastectomy, Right - DUCTAL CARCINOMA IN SITU WITH NECROSIS AND CALCIFICATIONS, 2 CM, RECURRENT. - MARGINS NOT INVOLVED. - PREVIOUS BIOPSY SITE. Microscopic Comment 3. BREAST, IN SITU CARCINOMA Specimen, including laterality: Right breast and two sentinel lymph nodes Procedure (include lymph node sampling sentinel-non-sentinel): Mastectomy and sentinel lymph node biopsies Grade of carcinoma: High grade Necrosis: Present Estimated tumor size: (glass slide measurement): 2 cm Distance to closest margin: 1.5 cm from posterior margin If margin positive, focally or broadly: N/A Breast prognostic profile: Case [id number] Estrogen receptor: 100%, positive, strong staining Progesterone receptor: 100%, positive, strong staining Lymph nodes: Examined: 2 Sentinel 0 Non-sentinel 2 Total Lymph nodes with metastasis: 0 Isolated tumor cells (< 0.2 mm): 0 Micrometastasis ( > 0.2 mm and < 2.0 mm): 0 Macrometastasis (> 2.0 mm): 0 Extranodal extension: N/A 1 of 2 FINAL for Mangione, Rileigh W [phone]) Microscopic Comment(continued) TNM: pTis, pN0, recurrent Comments: There is a 2 cm area of high grade ductal carcinoma in situ adjacent to the previous needle core biopsy and there is dense fibrosis consistent with previous lumpectomy site. (JDP:kh 07-10-16)  RADIOGRAPHIC STUDIES:  I have personally reviewed the radiological images as listed and agreed with the findings in the report. No results found.   Breast MRI 04/29/17 IMPRESSION: No suspicious areas of enhancement identified within the left breast.  Bone Density 01/30/17 AP Spine t-score -0.6 Femoral Neck Left T-score 0.4 Femoral Neck Right T-score 1.0 Bone scan normal    Bone  Density  04/08/13 AP Spine T-score -0.5 Femoral Neck Left T-score 0.6 Femoral Neck Right  0.7 Bone scan normal    ASSESSMENT & PLAN:  62 y.o. postmenopausal woman, presented with screening discovered DCIS in 04/2016, prior history of right breast DCIS in  2009.  1. Right breast DCIS with necrosis, high-grade DCIS, ER+ /PR+ -I previously discussed her breast imaging and needle biopsy results with patient and her husband in great detail. -This is a local recurrence at the same lumpectomy site from 2009 -Due to her prior breast radiation and the recurrent disease, she underwent right mastectomy and sentinel lymph node biopsy -I previously reviewed her surgical pathology findings, which confirmed high-grade DCIS with 2 courses, surgical margins were negative. Sentinel lymph nodes were negative. -due to her recurrent breast cancer, and family history of multiple cancers, she underwent genetic testing and it was negative  -She has started adjuvant anastrozole, tolerating very well, we'll continue for 5 years. -We again reviewed the potential side effects of anastrozole, especially osteopenia, osteoporosis and muscular and joint discomfort. -We again discussed breast cancer surveillance, Including annual mammogram, breast exam every 6-12 months. -She is clinically doing well. May Labs and today's labs reviewed and her vitamin D is normal and her cholesterol has improved. Her Breast exam and Bone density is normal.  -Pt is Tolerating Anastrozole well, does have mild arthralgia, we'll continue for now. We discussed the option of switching to tamoxifen if arthralgia gets worse. -Pt's exam was unremarkable. Breast MRI from 04/29/17 showed no suspicious areas of enhancement. Labs reviewed (09/09/17) and CBC and CMP are WNL.  No clinical concern fro recurrence.  -F/u in 6 months with lab   2. Bone health -We again discussed her that anastrozole may weaken her bone -I encouraged her to continue calcium and  vitamin D supplement, and exercise -Her PCP checks her vitamin D every year, it was normal in 12/2016 -Bone Density Scan from 01/30/17 is normal. Will repeat in 2020  3. Arthralgia -She has a mild joint pain and stiffness in the right hip, and new trigger finger on right thumb  -We discussed potential arthralgia from letrozole, I encouraged her to exercise regularly, and stay active. -We'll continue monitoring. -We previously discussed her the option of switching her anastrozole to tamoxifen if her arthralgia gets worse.  Plan  -Continue Anastrozole. Refilled today.  -I will see her back in 6 months with labs   All questions were answered. The patient knows to call the clinic with any problems, questions or concerns.  I spent 15 minutes counseling the patient face to face. The total time spent in the appointment was 20 minutes and more than 50% was on counseling.  This document serves as a record of services personally performed by Truitt Merle, MD. It was created on her behalf by Theresia Bough, a trained medical scribe. The creation of this record is based on the scribe's personal observations and the provider's statements to them.   I have reviewed the above documentation for accuracy and completeness, and I agree with the above.     Truitt Merle, MD  09/09/2017 1:32 PM

## 2017-09-09 ENCOUNTER — Telehealth: Payer: Self-pay | Admitting: Hematology

## 2017-09-09 ENCOUNTER — Encounter: Payer: Self-pay | Admitting: Hematology

## 2017-09-09 ENCOUNTER — Inpatient Hospital Stay: Payer: BC Managed Care – PPO | Attending: Hematology | Admitting: Hematology

## 2017-09-09 ENCOUNTER — Inpatient Hospital Stay: Payer: BC Managed Care – PPO

## 2017-09-09 VITALS — BP 143/74 | HR 70 | Temp 98.4°F | Resp 18 | Ht 69.0 in | Wt 196.1 lb

## 2017-09-09 DIAGNOSIS — Z800 Family history of malignant neoplasm of digestive organs: Secondary | ICD-10-CM | POA: Diagnosis not present

## 2017-09-09 DIAGNOSIS — D0511 Intraductal carcinoma in situ of right breast: Secondary | ICD-10-CM | POA: Diagnosis not present

## 2017-09-09 DIAGNOSIS — Z79811 Long term (current) use of aromatase inhibitors: Secondary | ICD-10-CM | POA: Diagnosis not present

## 2017-09-09 DIAGNOSIS — Z803 Family history of malignant neoplasm of breast: Secondary | ICD-10-CM | POA: Insufficient documentation

## 2017-09-09 DIAGNOSIS — Z923 Personal history of irradiation: Secondary | ICD-10-CM | POA: Diagnosis not present

## 2017-09-09 DIAGNOSIS — Z853 Personal history of malignant neoplasm of breast: Secondary | ICD-10-CM | POA: Diagnosis not present

## 2017-09-09 DIAGNOSIS — M6530 Trigger finger, unspecified finger: Secondary | ICD-10-CM | POA: Diagnosis not present

## 2017-09-09 DIAGNOSIS — Z170 Estrogen receptor positive status [ER+]: Secondary | ICD-10-CM | POA: Diagnosis not present

## 2017-09-09 DIAGNOSIS — Z808 Family history of malignant neoplasm of other organs or systems: Secondary | ICD-10-CM | POA: Diagnosis not present

## 2017-09-09 DIAGNOSIS — Z8042 Family history of malignant neoplasm of prostate: Secondary | ICD-10-CM | POA: Insufficient documentation

## 2017-09-09 LAB — CBC WITH DIFFERENTIAL/PLATELET
BASOS ABS: 0 10*3/uL (ref 0.0–0.1)
Basophils Relative: 1 %
EOS PCT: 3 %
Eosinophils Absolute: 0.2 10*3/uL (ref 0.0–0.5)
HEMATOCRIT: 40.2 % (ref 34.8–46.6)
HEMOGLOBIN: 13 g/dL (ref 11.6–15.9)
LYMPHS ABS: 2.2 10*3/uL (ref 0.9–3.3)
LYMPHS PCT: 38 %
MCH: 28.6 pg (ref 25.1–34.0)
MCHC: 32.3 g/dL (ref 31.5–36.0)
MCV: 88.5 fL (ref 79.5–101.0)
Monocytes Absolute: 0.4 10*3/uL (ref 0.1–0.9)
Monocytes Relative: 6 %
NEUTROS ABS: 3 10*3/uL (ref 1.5–6.5)
NEUTROS PCT: 52 %
PLATELETS: 316 10*3/uL (ref 145–400)
RBC: 4.54 MIL/uL (ref 3.70–5.45)
RDW: 13.7 % (ref 11.2–16.1)
WBC: 5.8 10*3/uL (ref 3.9–10.3)

## 2017-09-09 LAB — COMPREHENSIVE METABOLIC PANEL
ALT: 17 U/L (ref 0–55)
ANION GAP: 6 (ref 3–11)
AST: 20 U/L (ref 5–34)
Albumin: 3.8 g/dL (ref 3.5–5.0)
Alkaline Phosphatase: 63 U/L (ref 40–150)
BILIRUBIN TOTAL: 0.2 mg/dL (ref 0.2–1.2)
BUN: 14 mg/dL (ref 7–26)
CO2: 29 mmol/L (ref 22–29)
Calcium: 9.6 mg/dL (ref 8.4–10.4)
Chloride: 106 mmol/L (ref 98–109)
Creatinine, Ser: 0.81 mg/dL (ref 0.60–1.10)
Glucose, Bld: 84 mg/dL (ref 70–140)
POTASSIUM: 4.5 mmol/L (ref 3.3–4.7)
Sodium: 141 mmol/L (ref 136–145)
TOTAL PROTEIN: 7.6 g/dL (ref 6.4–8.3)

## 2017-09-09 MED ORDER — ANASTROZOLE 1 MG PO TABS: 1.0000 mg | ORAL_TABLET | Freq: Every day | ORAL | 1 refills | Status: DC

## 2017-09-09 NOTE — Telephone Encounter (Signed)
Scheduled appt per 1/28 los - Pt is aware of appt date and time - did not want AVS or calender printed out.

## 2017-10-24 IMAGING — US ULTRASOUND RIGHT BREAST LIMITED
1 series · 10 of 10 positions shown · non-contrast
Comparison: MRI on 04/30/2016

CLINICAL DATA: Patient returns after recent MRI for evaluation of
the right breast and axilla. Patient reports a recent flu
vaccination performed prior to the MRI.

EXAM:
ULTRASOUND OF THE RIGHT BREAST

[Series 1: ultrasound right breast limited · 0.07mm/px · 10 of 10 slices shown]
[im 1/10]
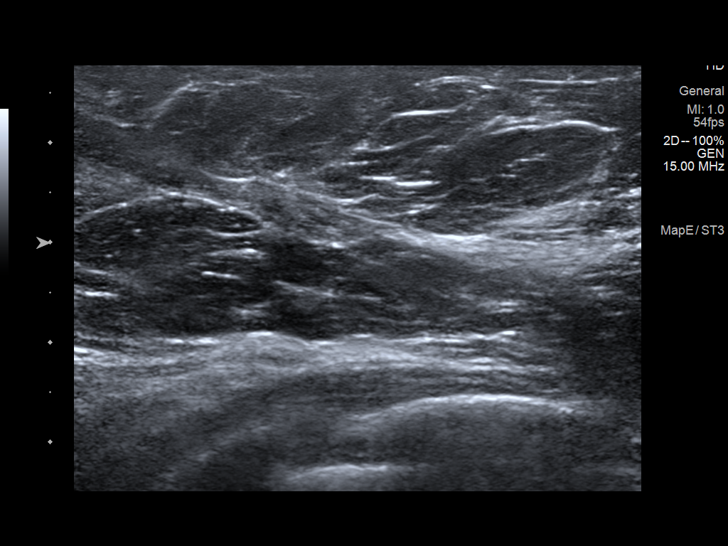
[im 2/10]
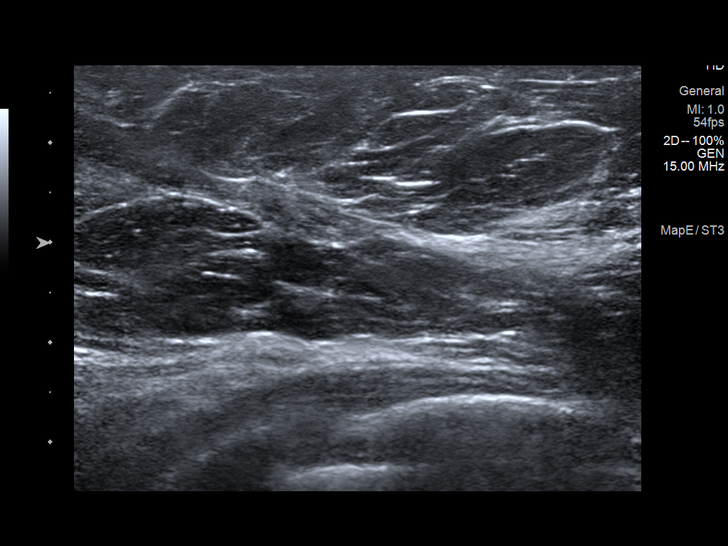
[im 3/10]
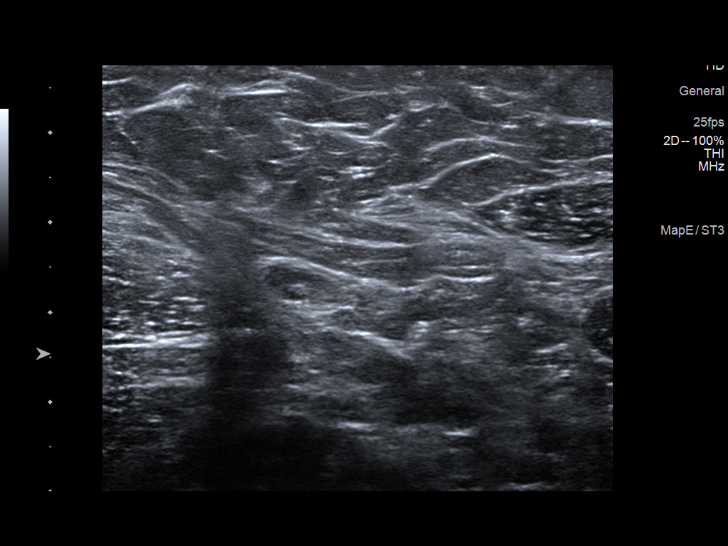
[im 4/10]
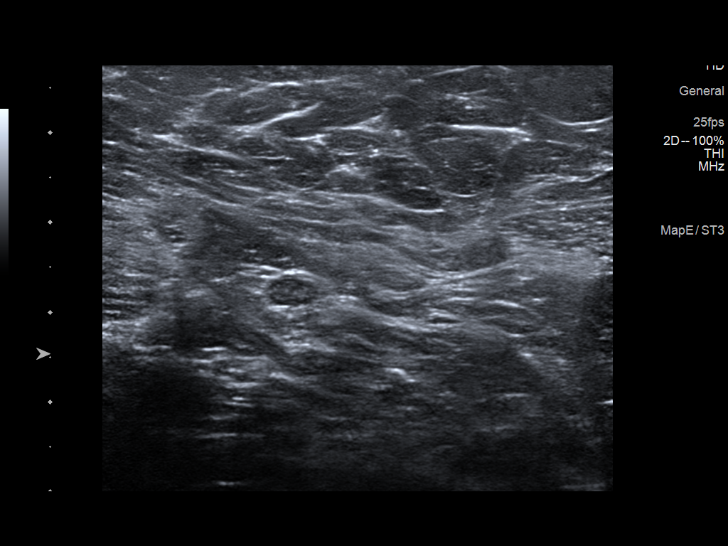
[im 5/10]
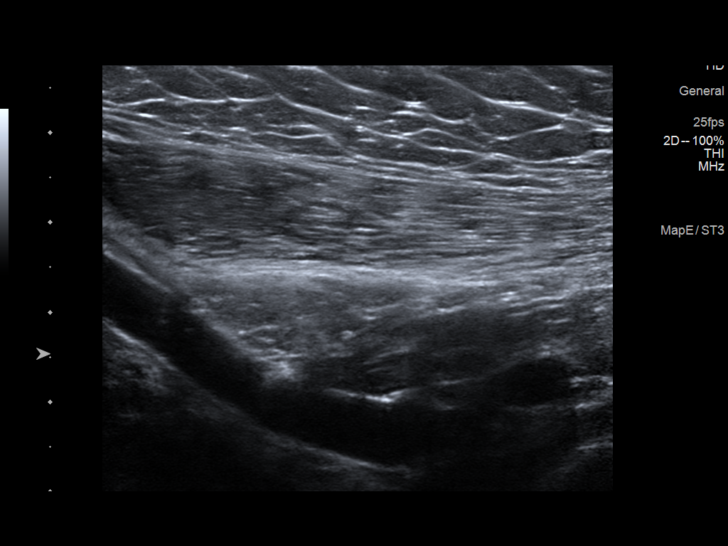
[im 6/10]
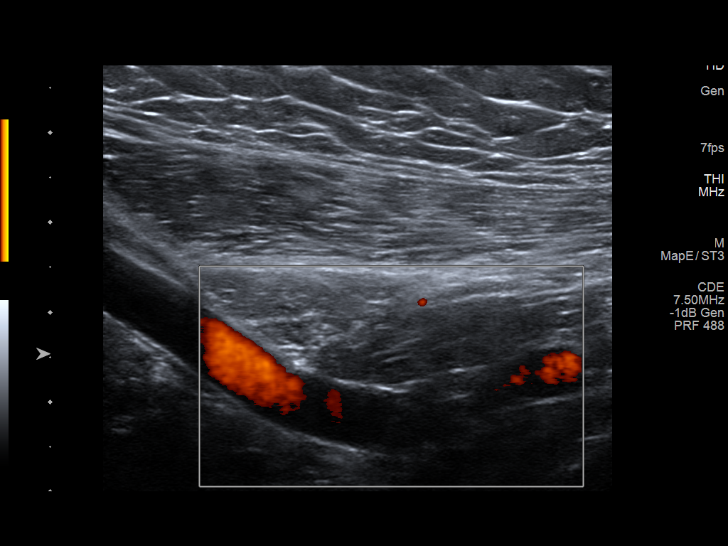
[im 7/10]
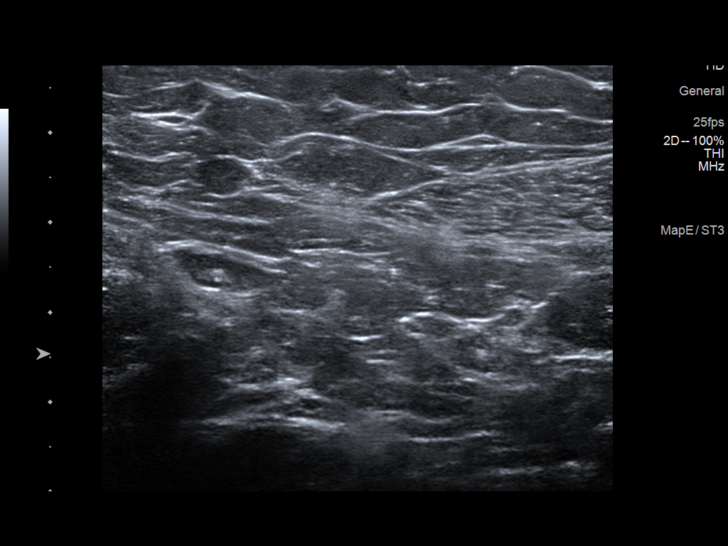
[im 8/10]
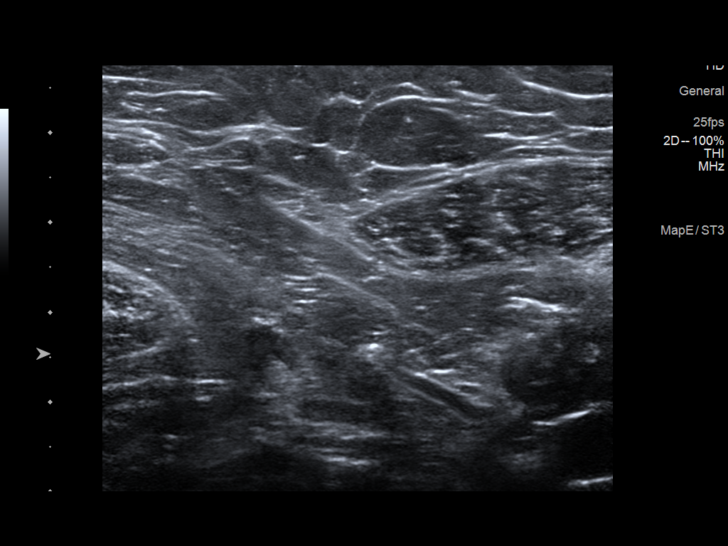
[im 9/10]
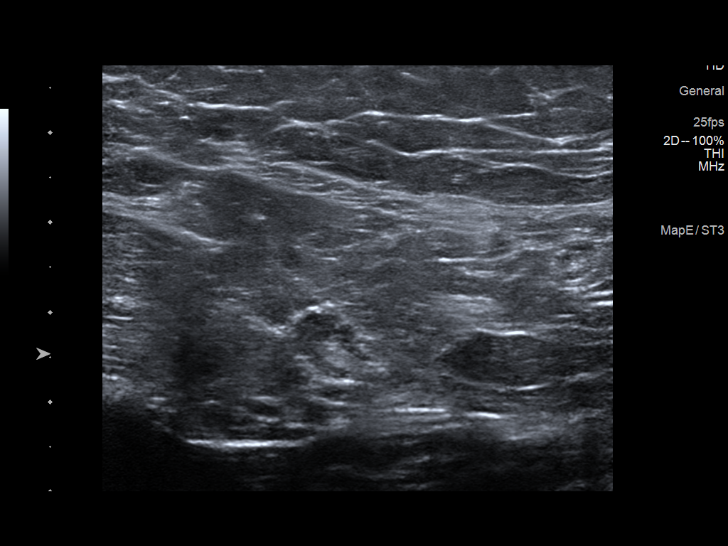
[im 10/10]
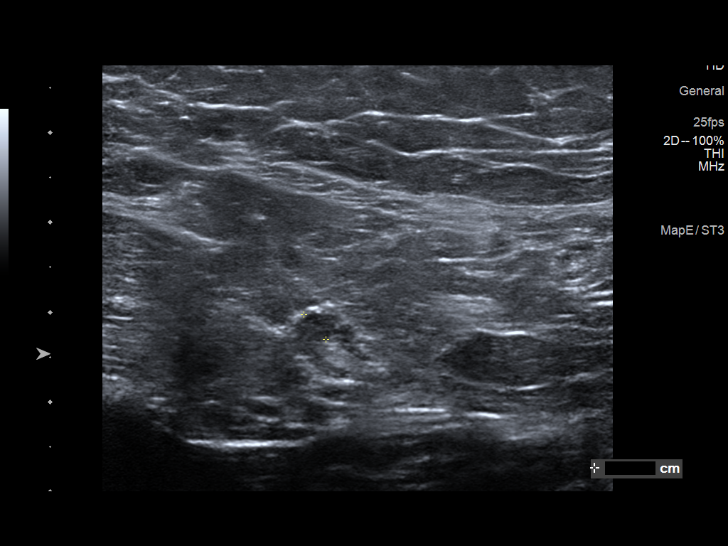

[10 of 10 positions shown; findings below may reference images not displayed]

FINDINGS: On physical exam, I palpate no abnormality in the retroareolar
region of the right breast. Well-healed lumpectomy scar is
identified in the 9 o'clock location of the right breast. I palpate
no abnormality in the right axilla.

Targeted ultrasound is performed, showing expected lumpectomy
changes in the lateral portion of the right breast. No mass
identified. Evaluation of the right axilla shows normal appearing
axillary lymph nodes. A single lymph node has borderline smooth
cortical thickening. No suspicious lymph nodes are identified.
IMPRESSION: 1. No sonographic abnormality. Specifically no correlate for the
area of enhancement the lumpectomy scar in the central portion of
the right breast.
2. No suspicious adenopathy by ultrasound. Mildly prominent lymph
nodes may be reactive and related to recent flu vaccine.

RECOMMENDATION:
MR guided biopsy of the enhancement within the right breast is
recommended.

I have discussed the findings and recommendations with the patient.
Results were also provided in writing at the conclusion of the
visit. If applicable, a reminder letter will be sent to the patient
regarding the next appointment.

BI-RADS CATEGORY  4: Suspicious.

## 2018-03-05 NOTE — Progress Notes (Signed)
[name]  [phone] Fax:([phone]  Clinic Follow Up Note   Patient Care Team: Marylynn Pearson, MD as PCP - General (Obstetrics and Gynecology) Excell Seltzer, MD as Consulting Physician (General Surgery) Truitt Merle, MD as Consulting Physician (Hematology) Delice Bison Charlestine Massed, NP as Nurse Practitioner (Hematology and Oncology)   Date of Service:  03/10/2018  CHIEF COMPLAINTS:  Follow up recurrent right breast DCIS   Oncology History   Breast cancer of upper-outer quadrant of right female breast [facility])   Staging form: Breast, AJCC 7th Edition   - Clinical stage from 05/15/2016: Stage 0 (Tis (DCIS), N0, M0) - Signed by Truitt Merle, MD on 06/18/2016   - Pathologic stage from 07/09/2016: Stage 0 (Tis (DCIS), N0, cM0) - Signed by Truitt Merle, MD on 07/30/2016  Cancer of lower-outer quadrant of female breast right [place]is[place] S/P lumpetomy, radiation, declined hormones 04/2008   Staging form: Breast, AJCC 7th Edition   - Clinical stage from 04/21/2008: Stage 0 (Tis (DCIS), N0, M0) - Signed by Truitt Merle, MD on 06/18/2016      Cancer of lower-outer quadrant of female breast right TisNoMo S/P lumpetomy, radiation, declined hormones 04/2008   05/16/2011 Initial Diagnosis    Cancer of lower-outer quadrant of female breast right TisNoMo S/P lumpetomy, radiation, declined hormones 04/2008       Ductal carcinoma in situ (DCIS) of right breast   04/20/2016 Mammogram    Screening mammogram showed no evidence of malignancy. Expected post lumpectomy change in the right breast.      04/30/2016 Imaging    Bilateral breast MRI with and without contrast showed new enhancement measuring 2.3 x 1.2 x 0.8 cm in the upper outer quadrant of the right lumpectomy site and extending anteriorly, suspicious for recurrent DCIS. Interval increase in number and size of level I, 2 and 3 right axillary lymph node, largest 1 cm.      05/09/2016 Imaging    Ultrasound of the right breast  and axilla showed no sonographic abnormality, no suspicious adenopathy by ultrasound. Moderately prominent lymph nodes may be reactive and related to recent flu vaccine.      05/15/2016 Initial Biopsy    MRI guided right breast core needle biopsy showed DCIS with calcifications, intermediate to high-grade.      05/15/2016 Receptors her2    ER 100% positive, PR 100% positive      05/15/2016 Initial Diagnosis    Breast cancer of upper-outer quadrant of right female breast ([place])      07/09/2016 Surgery    Right mastectomy and SLN biopsy       07/09/2016 Pathology Results    Right mastectomy showed DCIS with necrosis and calcification, high grade,  2 cm, recurrent, margins were negative, 2 sentinel lymph nodes were negative.      09/2016 -  Anti-estrogen oral therapy    Anastrozole 1 mg daily       04/29/2017 Mammogram    IMPRESSION: No suspicious areas of enhancement identified within the left breast.       HISTORY OF PRESENTING ILLNESS:  Kim Webster 62 y.o. female is here because of her recently diagnosed recurrent right breast DCIS. She is accompanied by her husband to my clinic today. She was referred by her surgeon Dr. Excell Seltzer.   She has a right breast DCIS, status post lumpectomy and are adjuvant radiation in 2009, she was seen by a medical oncologist Dr. Berdie Ogren at that time and decided not to pursue chemoprevention due to  the limited benefit and concern of side effects. She has been followed by annual screening mammogram since then, her last one was unremarkable in early September 2017. She has been also doing screening breast MRI every 2 years, and she had a repeated MRI on 04/30/2016, which showed a 2.3 cm new enhancement in the upper outer quadrant of right breast, and the previous lumpectomy site. The MRI also showed prominent right axillary lymph nodes. She subsequently underwent ultrasound of the right breast and axilla, which was negative, lymph nodes are slightly  prominent, but not suspicious for malignancy. It was felt to be related to her recent flu shot. She underwent MRI guided right breast mass biopsy, which showed DCIS with calcification, intermediate to high-grade, ER and PR strongly positive.  She feels well, no complains, mild arthritis in bilateral sounds, no other significant arthralgia. She is a retired Pharmacist, hospital, lives with her husband, remains to be physically active, has good appetite and energy level, no other complaints.  GYN HISTORY  Menarchal: 15 LMP: 9 Contraceptive: 4 years  HRT: none  G3P1: one son 72 yo   CURRENT THERAPY: Anastrozole 64m daily started 09/2016  INTERIM HISTORY:  Kim HACKLEYreturns for follow up of her right breast cancer. She was last seen by me 6 months ago. She presents to the clinic today by herself. She notes she is doing well with no concerns. She notes she is still on Anastrozole and tolerates well. She notes her original manufacturer is no longer making anastrozole so she switched to a different manufacturer this week, She denies hot flashes or joint pain, chest discomfort or new pain. She notes right breast numbness still, but no pain. She notes she purposefully lost 23 pounds with weight watchers.  She is due for Mammogram and MRI in 04/2018.   MEDICAL HISTORY:  Past Medical History:  Diagnosis Date  . Arthritis    "mild; thumb joints" (07/09/2016)  . Blood transfusion 1986   "related to ruptured ectopic pregnancy"  . Breast cancer, right breast ([place] 2009; 06/2016  . Ectopic pregnancy 1986   ruptured  . Family history of breast cancer   . Family history of colon cancer   . Family history of pancreatic cancer   . Family history of prostate cancer   . Hypothyroidism   . Migraine    "~ twice/year" (07/09/2016)  . Pneumonia ~ 2007   "walking pneumonia"  . Thyroid disease    hypothyroism    SURGICAL HISTORY: Past Surgical History:  Procedure Laterality Date  . BREAST BIOPSY Right 2009;  05/2016  . BREAST LUMPECTOMY Right 2009  . ECTOPIC PREGNANCY SURGERY  1986  . MASTECTOMY COMPLETE / SIMPLE W/ SENTINEL NODE BIOPSY Right 07/09/2016  . MASTECTOMY W/ SENTINEL NODE BIOPSY Right 07/09/2016   Procedure: RIGHT BREAST MASTECTOMY WITH RIGHT SENTINEL LYMPH NODE BIOPSY;  Surgeon: BExcell Seltzer MD;  Location: [facility]  Service: General;  Laterality: Right;    SOCIAL HISTORY: Social History   Socioeconomic History  . Marital status: Married    Spouse name: Not on file  . Number of children: Not on file  . Years of education: Not on file  . Highest education level: Not on file  Occupational History  . Not on file  Social Needs  . Financial resource strain: Not on file  . Food insecurity:    Worry: Not on file    Inability: Not on file  . Transportation needs:    Medical: Not on file  Non-medical: Not on file  Tobacco Use  . Smoking status: Never Smoker  . Smokeless tobacco: Never Used  Substance and Sexual Activity  . Alcohol use: No  . Drug use: No  . Sexual activity: Yes  Lifestyle  . Physical activity:    Days per week: Not on file    Minutes per session: Not on file  . Stress: Not on file  Relationships  . Social connections:    Talks on phone: Not on file    Gets together: Not on file    Attends religious service: Not on file    Active member of club or organization: Not on file    Attends meetings of clubs or organizations: Not on file    Relationship status: Not on file  . Intimate partner violence:    Fear of current or ex partner: Not on file    Emotionally abused: Not on file    Physically abused: Not on file    Forced sexual activity: Not on file  Other Topics Concern  . Not on file  Social History Narrative  . Not on file    FAMILY HISTORY: Family History  Problem Relation Age of Onset  . Cancer Mother 42       breast cancer  . Breast cancer Mother   . Cancer Father 35       liver cancer   . Bladder Cancer Father   . Prostate  cancer Father   . Cancer Paternal Aunt 9       colon cancer   . Cancer Paternal Grandmother 33       pancreatic cancer     ALLERGIES:  is allergic to vicodin [hydrocodone-acetaminophen].  MEDICATIONS:  Current Outpatient Medications  Medication Sig Dispense Refill  . anastrozole (ARIMIDEX) 1 MG tablet Take 1 tablet (1 mg total) by mouth daily. 90 tablet 1  . Calcium Carbonate-Vitamin D (CALTRATE 600+D PO) Take 1 tablet by mouth daily at 12 noon.     . Calcium-Vitamin D-Vitamin K (VIACTIV) 093-235-57 MG-UNT-MCG CHEW Chew 1 tablet by mouth daily at 12 noon.    Marland Kitchen ibuprofen (MOTRIN IB) 200 MG tablet Take 200 mg by mouth every 8 (eight) hours as needed (for pain.).    Marland Kitchen levothyroxine (SYNTHROID, LEVOTHROID) 88 MCG tablet Take 88 mcg by mouth daily before breakfast.     . Multiple Vitamins-Minerals (CENTRUM SILVER PO) Take 1 tablet by mouth daily at 12 noon.      No current facility-administered medications for this visit.     REVIEW OF SYSTEMS:   Constitutional: Denies fevers, chills or abnormal night sweats  (+) purposeful weight loss Eyes: Denies blurriness of vision, double vision or watery eyes Ears, nose, mouth, throat, and face: Denies mucositis or sore throat Respiratory: Denies cough, dyspnea or wheezes Cardiovascular: Denies palpitation, chest discomfort or lower extremity swelling Gastrointestinal:  Denies nausea, heartburn or change in bowel habits Skin: Denies abnormal skin rashes Lymphatics: Denies new lymphadenopathy or easy bruising Neurological:Denies numbness, tingling or new weaknesses Behavioral/Psych: Mood is stable, no new changes  BREAST: (+) right breast numbness All other systems were reviewed with the patient and are negative.  PHYSICAL EXAMINATION:  ECOG PERFORMANCE STATUS: 0 - Asymptomatic  Vitals:   03/10/18 0809  BP: 132/65  Pulse: 71  Resp: 14  Temp: 98.3 F (36.8 C)  SpO2: 100%   Filed Weights   03/10/18 0809  Weight: 172 lb 11.2 oz (78.3  kg)     GENERAL:alert, no distress  and comfortable SKIN: skin color, texture, turgor are normal, no rashes or significant lesions EYES: normal, conjunctiva are pink and non-injected, sclera clear OROPHARYNX:no exudate, no erythema and lips, buccal mucosa, and tongue normal  NECK: supple, thyroid normal size, non-tender, without nodularity LYMPH:  no palpable lymphadenopathy in the cervical, axillary or inguinal LUNGS: clear to auscultation and percussion with normal breathing effort HEART: regular rate & rhythm and no murmurs and no lower extremity edema ABDOMEN:abdomen soft, non-tender and normal bowel sounds Musculoskeletal:no cyanosis of digits and no clubbing  PSYCH: alert & oriented x 3 with fluent speech NEURO: no focal motor/sensory deficits Breasts: Status post right mastectomy: surgical incision is healing well, no skin erythema or discharge. Palpation of the left breast and axilla revealed no obvious mass that I could appreciate. Left breast normal.   LABORATORY DATA:  I have reviewed the data as listed CBC Latest Ref Rng & Units 03/10/2018 09/09/2017 03/11/2017  WBC 3.9 - 10.3 K/uL 5.3 5.8 6.7  Hemoglobin 11.6 - 15.9 g/dL 12.6 13.0 13.6  Hematocrit 34.8 - 46.6 % 37.6 40.2 40.6  Platelets 145 - 400 K/uL 286 316 297   CMP Latest Ref Rng & Units 03/10/2018 09/09/2017 03/11/2017  Glucose 70 - 99 mg/dL 86 84 84  BUN 6 - 20 mg/dL 13 14 14.4  Creatinine 0.44 - 1.00 mg/dL 0.84 0.81 0.8  Sodium 135 - 145 mmol/L 140 141 139  Potassium 3.5 - 5.1 mmol/L 4.2 4.5 4.5  Chloride 98 - 111 mmol/L 105 106 -  CO2 22 - 32 mmol/L _0 Calcium 8.9 - 10.3 mg/dL 9.6 9.6 10.0  Total Protein 6.5 - 8.1 g/dL 7.4 7.6 7.7  Total Bilirubin 0.3 - 1.2 mg/dL 0.3 0.2 0.26  Alkaline Phos 38 - 126 U/L 61 63 64  AST 15 - 41 U/L _1 ALT 0 - 44 U/L _2 PATHOLOGY REPORT  Diagnosis 05/15/2016 Breast, right, needle core biopsy, lateral - DUCTAL CARCINOMA IN SITU WITH CALCIFICATIONS. -  FIBROCYSTIC CHANGES WITH ADENOSIS AND CALCIFICATIONS. - SEE COMMENT. The carcinoma appears intermediate to high grade. Estrogen receptor and progesterone receptor studies will be performed and the results reported separately. The results were called to [facility] on 05/16/16. (JBK:gt, 05/16/16) Results: IMMUNOHISTOCHEMICAL AND MORPHOMETRIC ANALYSIS PERFORMED MANUALLY Estrogen Receptor: 100%, POSITIVE, STRONG STAINING INTENSITY Progesterone Receptor: 100%, POSITIVE, STRONG STAINING INTENSITY  Diagnosis 07/09/2016 1. Lymph node, sentinel, biopsy, Right axillary #1 - ONE BENIGN LYMPH NODE (0/1). 2. Lymph node, sentinel, biopsy, Right axillary #2 - ONE BENIGN LYMPH NODE (0/1). 3. Breast, simple mastectomy, Right - DUCTAL CARCINOMA IN SITU WITH NECROSIS AND CALCIFICATIONS, 2 CM, RECURRENT. - MARGINS NOT INVOLVED. - PREVIOUS BIOPSY SITE. Microscopic Comment 3. BREAST, IN SITU CARCINOMA Specimen, including laterality: Right breast and two sentinel lymph nodes Procedure (include lymph node sampling sentinel-non-sentinel): Mastectomy and sentinel lymph node biopsies Grade of carcinoma: High grade Necrosis: Present Estimated tumor size: (glass slide measurement): 2 cm Distance to closest margin: 1.5 cm from posterior margin If margin positive, focally or broadly: N/A Breast prognostic profile: Case [id number] Estrogen receptor: 100%, positive, strong staining Progesterone receptor: 100%, positive, strong staining Lymph nodes: Examined: 2 Sentinel 0 Non-sentinel 2 Total Lymph nodes with metastasis: 0 Isolated tumor cells (< 0.2 mm): 0 Micrometastasis ( > 0.2 mm and < 2.0 mm): 0 Macrometastasis (> 2.0 mm): 0 Extranodal extension: N/A 1 of 2 FINAL for [place], [place] [id number]) Microscopic Comment(continued) TNM: pTis, pN0,  recurrent Comments: There is a 2 cm area of high grade ductal carcinoma in situ adjacent to the previous needle core biopsy and there is  dense fibrosis consistent with previous lumpectomy site. (JDP:kh 07-10-16)  RADIOGRAPHIC STUDIES:  I have personally reviewed the radiological images as listed and agreed with the findings in the report. No results found.   Breast MRI 04/29/17 IMPRESSION: No suspicious areas of enhancement identified within the left breast.  Bone Density 01/30/17 AP Spine t-score -0.6 Femoral Neck Left T-score 0.4 Femoral Neck Right T-score 1.0 Bone scan normal    Bone Density  04/08/13 AP Spine T-score -0.5 Femoral Neck Left T-score 0.6 Femoral Neck Right 0.7 Bone scan normal    ASSESSMENT & PLAN:  62 y.o. postmenopausal woman, presented with screening discovered DCIS in 04/2016, prior history of right breast DCIS in  2009.  1. Right breast DCIS with necrosis, high-grade DCIS, ER+ /PR+ -I previously discussed her breast imaging and needle biopsy results with patient in great detail. -This is a local recurrence at the same lumpectomy site from 2009 -Due to her prior breast radiation and the recurrent disease, she underwent right mastectomy and sentinel lymph node biopsy on 06/2016. -due to her recurrent breast cancer, and family history of multiple cancers, she underwent genetic testing and it was negative  -She has started adjuvant anastrozole, tolerating very well, we'll continue for 5 years. -We previously discussed breast cancer surveillance, Including annual mammogram, breast exam every 6-12 months. -She is clinically doing well. Lab reviewed, her CBC is WNL and CMP is still pending. Her physical exam and her 04/2018 mammogram and breast MRI were unremarkable. There is no clinical concern for recurrence. -I discussed the benefit of separating mammogram and Breast MRI 6 months apart. She is agreeable. I briefly discussed the option of Fast MRI in the future if she has financial concerns for regular breast MRI. She prefers to stay with regular MRIs.  -will schedule Left breast Mammogram in 04/2018  and MRI in 10/2018. -Continue Anastrozole. Refilled today  -F/u in 6 months with lab   2. Bone health -We previously discussed that anastrozole may weaken her bone -Her PCP checks her vitamin D every year, it was normal in 12/2016 -Bone Density Scan from 01/30/17 is normal. Will repeat in 03/2019 -Continue calcium and vitamin D supplement, and exercise  3. Arthralgia  -She has a mild joint pain and stiffness in the right hip, and new trigger finger on right thumb  -We previously discussed potential arthralgia from letrozole, I encouraged her to exercise regularly, and stay active. -We previously discussed her the option of switching her anastrozole to tamoxifen if her arthralgia gets worse. -Resolved now. She currently has no joint pain. She will continue on Anastrozole.   Plan  -Continue Anastrozole, refilled today  -Left mammogram in 04/2018 at [facility] -Breast MRI in 10/2018  -Lab and f/u in 6 months   All questions were answered. The patient knows to call the clinic with any problems, questions or concerns.  I spent 15 minutes counseling the patient face to face. The total time spent in the appointment was 20 minutes and more than 50% was on counseling.  Oneal Deputy, am acting as scribe for Truitt Merle, MD.   I have reviewed the above documentation for accuracy and completeness, and I agree with the above.     Truitt Merle, MD  03/10/2018 10:26 AM

## 2018-03-10 ENCOUNTER — Inpatient Hospital Stay: Payer: BC Managed Care – PPO | Attending: Hematology | Admitting: Hematology

## 2018-03-10 ENCOUNTER — Inpatient Hospital Stay: Payer: BC Managed Care – PPO

## 2018-03-10 ENCOUNTER — Telehealth: Payer: Self-pay

## 2018-03-10 ENCOUNTER — Encounter: Payer: Self-pay | Admitting: Hematology

## 2018-03-10 VITALS — BP 132/65 | HR 71 | Temp 98.3°F | Resp 14 | Ht 69.0 in | Wt 172.7 lb

## 2018-03-10 DIAGNOSIS — D0511 Intraductal carcinoma in situ of right breast: Secondary | ICD-10-CM

## 2018-03-10 DIAGNOSIS — Z800 Family history of malignant neoplasm of digestive organs: Secondary | ICD-10-CM

## 2018-03-10 DIAGNOSIS — M6530 Trigger finger, unspecified finger: Secondary | ICD-10-CM | POA: Diagnosis not present

## 2018-03-10 DIAGNOSIS — C773 Secondary and unspecified malignant neoplasm of axilla and upper limb lymph nodes: Secondary | ICD-10-CM | POA: Insufficient documentation

## 2018-03-10 DIAGNOSIS — M25551 Pain in right hip: Secondary | ICD-10-CM

## 2018-03-10 DIAGNOSIS — Z8042 Family history of malignant neoplasm of prostate: Secondary | ICD-10-CM

## 2018-03-10 DIAGNOSIS — Z853 Personal history of malignant neoplasm of breast: Secondary | ICD-10-CM | POA: Insufficient documentation

## 2018-03-10 DIAGNOSIS — Z170 Estrogen receptor positive status [ER+]: Secondary | ICD-10-CM | POA: Insufficient documentation

## 2018-03-10 DIAGNOSIS — Z803 Family history of malignant neoplasm of breast: Secondary | ICD-10-CM

## 2018-03-10 DIAGNOSIS — Z923 Personal history of irradiation: Secondary | ICD-10-CM | POA: Diagnosis not present

## 2018-03-10 DIAGNOSIS — Z79811 Long term (current) use of aromatase inhibitors: Secondary | ICD-10-CM | POA: Diagnosis not present

## 2018-03-10 LAB — CBC WITH DIFFERENTIAL/PLATELET
Basophils Absolute: 0 10*3/uL (ref 0.0–0.1)
Basophils Relative: 1 %
EOS PCT: 4 %
Eosinophils Absolute: 0.2 10*3/uL (ref 0.0–0.5)
HCT: 37.6 % (ref 34.8–46.6)
Hemoglobin: 12.6 g/dL (ref 11.6–15.9)
LYMPHS ABS: 2.4 10*3/uL (ref 0.9–3.3)
Lymphocytes Relative: 46 %
MCH: 29 pg (ref 25.1–34.0)
MCHC: 33.4 g/dL (ref 31.5–36.0)
MCV: 86.8 fL (ref 79.5–101.0)
MONO ABS: 0.3 10*3/uL (ref 0.1–0.9)
MONOS PCT: 6 %
Neutro Abs: 2.2 10*3/uL (ref 1.5–6.5)
Neutrophils Relative %: 43 %
PLATELETS: 286 10*3/uL (ref 145–400)
RBC: 4.34 MIL/uL (ref 3.70–5.45)
RDW: 14 % (ref 11.2–14.5)
WBC: 5.3 10*3/uL (ref 3.9–10.3)

## 2018-03-10 LAB — COMPREHENSIVE METABOLIC PANEL
ALT: 16 U/L (ref 0–44)
ANION GAP: 7 (ref 5–15)
AST: 23 U/L (ref 15–41)
Albumin: 4 g/dL (ref 3.5–5.0)
Alkaline Phosphatase: 61 U/L (ref 38–126)
BUN: 13 mg/dL (ref 6–20)
CHLORIDE: 105 mmol/L (ref 98–111)
CO2: 28 mmol/L (ref 22–32)
CREATININE: 0.84 mg/dL (ref 0.44–1.00)
Calcium: 9.6 mg/dL (ref 8.9–10.3)
Glucose, Bld: 86 mg/dL (ref 70–99)
Potassium: 4.2 mmol/L (ref 3.5–5.1)
Sodium: 140 mmol/L (ref 135–145)
Total Bilirubin: 0.3 mg/dL (ref 0.3–1.2)
Total Protein: 7.4 g/dL (ref 6.5–8.1)

## 2018-03-10 MED ORDER — ANASTROZOLE 1 MG PO TABS: 1.0000 mg | ORAL_TABLET | Freq: Every day | ORAL | 1 refills | Status: DC

## 2018-03-10 NOTE — Telephone Encounter (Signed)
Printed avs and calender of upcoming appointment. Per 7/29 los 

## 2018-04-24 ENCOUNTER — Ambulatory Visit
Admission: RE | Admit: 2018-04-24 | Discharge: 2018-04-24 | Disposition: A | Discharge status: Home / Self Care | Payer: BC Managed Care – PPO | Source: Ambulatory Visit | Attending: Hematology | Admitting: Hematology

## 2018-04-24 DIAGNOSIS — D0511 Intraductal carcinoma in situ of right breast: Secondary | ICD-10-CM

## 2018-09-05 NOTE — Progress Notes (Signed)
[place]   Telephone:(336) [phone] Fax:(336) [phone]   Clinic Follow up Note   Patient Care Team: Marylynn Pearson, MD as PCP - General (Obstetrics and Gynecology) Excell Seltzer, MD as Consulting Physician (General Surgery) Truitt Merle, MD as Consulting Physician (Hematology) Delice Bison Charlestine Massed, NP as Nurse Practitioner (Hematology and Oncology)  Date of Service:  09/08/2018  CHIEF COMPLAINT: Follow up recurrent right breast DCIS  SUMMARY OF ONCOLOGIC HISTORY: Oncology History   Breast cancer of upper-outer quadrant of right female breast [facility])   Staging form: Breast, AJCC 7th Edition   - Clinical stage from 05/15/2016: Stage 0 (Tis (DCIS), N0, M0) - Signed by Truitt Merle, MD on 06/18/2016   - Pathologic stage from 07/09/2016: Stage 0 (Tis (DCIS), N0, cM0) - Signed by Truitt Merle, MD on 07/30/2016  Cancer of lower-outer quadrant of female breast right TisNoMo S/P lumpetomy, radiation, declined hormones 04/2008   Staging form: Breast, AJCC 7th Edition   - Clinical stage from 04/21/2008: Stage 0 (Tis (DCIS), N0, M0) - Signed by Truitt Merle, MD on 06/18/2016      Cancer of lower-outer quadrant of female breast right TisNoMo S/P lumpetomy, radiation, declined hormones 04/2008   05/16/2011 Initial Diagnosis    Cancer of lower-outer quadrant of female breast right TisNoMo S/P lumpetomy, radiation, declined hormones 04/2008     Ductal carcinoma in situ (DCIS) of right breast   04/20/2016 Mammogram    Screening mammogram showed no evidence of malignancy. Expected post lumpectomy change in the right breast.    04/30/2016 Imaging    Bilateral breast MRI with and without contrast showed new enhancement measuring 2.3 x 1.2 x 0.8 cm in the upper outer quadrant of the right lumpectomy site and extending anteriorly, suspicious for recurrent DCIS. Interval increase in number and size of level I, 2 and 3 right axillary lymph node, largest 1 cm.    05/09/2016 Imaging    Ultrasound of the  right breast and axilla showed no sonographic abnormality, no suspicious adenopathy by ultrasound. Moderately prominent lymph nodes may be reactive and related to recent flu vaccine.    05/15/2016 Initial Biopsy    MRI guided right breast core needle biopsy showed DCIS with calcifications, intermediate to high-grade.    05/15/2016 Receptors her2    ER 100% positive, PR 100% positive    05/15/2016 Initial Diagnosis    Breast cancer of upper-outer quadrant of right female breast ([place])    07/09/2016 Surgery    Right mastectomy and SLN biopsy     07/09/2016 Pathology Results    Right mastectomy showed DCIS with necrosis and calcification, high grade,  2 cm, recurrent, margins were negative, 2 sentinel lymph nodes were negative.    09/2016 -  Anti-estrogen oral therapy    Anastrozole 1 mg daily     04/29/2017 Mammogram    IMPRESSION: No suspicious areas of enhancement identified within the left breast.      CURRENT THERAPY:  Anastrozole '1mg'$  daily started 09/2016  INTERVAL HISTORY:  ATIYA Webster is here for a follow up of her right breast DCIS. She was last seen by me 6 months ago. She presents to the clinic today by herself. She notes she is doing well. She is tolerating Anastrozole well and her joint pain resolved. I reviewed her medication list with her. She does still have right breast numbness post surgery.  She notes tingling and hotness of her hands happened once last week. This subsided soon after waking up from  this sensation.  She notes she has been doing weight watchers and lost about 20 pounds. She sees her PCP yearly in the summer. She sees her Gyn in January.      REVIEW OF SYSTEMS:   Constitutional: Denies fevers, chills or abnormal weight loss (+) purposeful weight loss  Eyes: Denies blurriness of vision Ears, nose, mouth, throat, and face: Denies mucositis or sore throat Respiratory: Denies cough, dyspnea or wheezes Cardiovascular: Denies palpitation, chest  discomfort or lower extremity swelling Gastrointestinal:  Denies nausea, heartburn or change in bowel habits Skin: Denies abnormal skin rashes Lymphatics: Denies new lymphadenopathy or easy bruising Neurological:Denies numbness, tingling or new weaknesses Behavioral/Psych: Mood is stable, no new changes  Breast: (+) right breast numbness  All other systems were reviewed with the patient and are negative.  MEDICAL HISTORY:  Past Medical History:  Diagnosis Date  . Arthritis    "mild; thumb joints" (07/09/2016)  . Blood transfusion 1986   "related to ruptured ectopic pregnancy"  . Breast cancer, right breast ([place]) 2009; 06/2016  . Ectopic pregnancy 1986   ruptured  . Family history of breast cancer   . Family history of colon cancer   . Family history of pancreatic cancer   . Family history of prostate cancer   . Hypothyroidism   . Migraine    "~ twice/year" (07/09/2016)  . Pneumonia ~ 2007   "walking pneumonia"  . Thyroid disease    hypothyroism    SURGICAL HISTORY: Past Surgical History:  Procedure Laterality Date  . BREAST BIOPSY Right 2009; 05/2016  . BREAST LUMPECTOMY Right 2009  . ECTOPIC PREGNANCY SURGERY  1986  . MASTECTOMY COMPLETE / SIMPLE W/ SENTINEL NODE BIOPSY Right 07/09/2016  . MASTECTOMY W/ SENTINEL NODE BIOPSY Right 07/09/2016   Procedure: RIGHT BREAST MASTECTOMY WITH RIGHT SENTINEL LYMPH NODE BIOPSY;  Surgeon: Excell Seltzer, MD;  Location: [place];  Service: General;  Laterality: Right;    I have reviewed the social history and family history with the patient and they are unchanged from previous note.  ALLERGIES:  is allergic to vicodin [hydrocodone-acetaminophen].  MEDICATIONS:  Current Outpatient Medications  Medication Sig Dispense Refill  . anastrozole (ARIMIDEX) 1 MG tablet Take 1 tablet (1 mg total) by mouth daily. 90 tablet 1  . Calcium Carbonate-Vitamin D (CALTRATE 600+D PO) Take 1 tablet by mouth daily at 12 noon.     . Calcium-Vitamin  D-Vitamin K (VIACTIV) [phone] MG-UNT-MCG CHEW Chew 1 tablet by mouth daily at 12 noon.    Marland Kitchen ibuprofen (MOTRIN IB) 200 MG tablet Take 200 mg by mouth every 8 (eight) hours as needed (for pain.).    Marland Kitchen levothyroxine (SYNTHROID, LEVOTHROID) 88 MCG tablet Take 88 mcg by mouth daily before breakfast.     . Multiple Vitamins-Minerals (CENTRUM SILVER PO) Take 1 tablet by mouth daily at 12 noon.      No current facility-administered medications for this visit.     PHYSICAL EXAMINATION: ECOG PERFORMANCE STATUS: 0 - Asymptomatic  Vitals:   09/08/18 0832  BP: 123/70  Pulse: 72  Resp: 17  Temp: 98.6 F (37 C)  SpO2: 100%   Filed Weights   09/08/18 0832  Weight: 172 lb (78 kg)    GENERAL:alert, no distress and comfortable SKIN: skin color, texture, turgor are normal, no rashes or significant lesions EYES: normal, Conjunctiva are pink and non-injected, sclera clear OROPHARYNX:no exudate, no erythema and lips, buccal mucosa, and tongue normal  NECK: supple, thyroid normal size, non-tender, without nodularity LYMPH:  no palpable lymphadenopathy in the cervical, axillary or inguinal LUNGS: clear to auscultation and percussion with normal breathing effort HEART: regular rate & rhythm and no murmurs and no lower extremity edema ABDOMEN:abdomen soft, non-tender and normal bowel sounds Musculoskeletal:no cyanosis of digits and no clubbing  NEURO: alert & oriented x 3 with fluent speech, no focal motor/sensory deficits BREAST: s/p right breast lumpectomy: surgical incision healed well, no palpable mass or adenopathy  LABORATORY DATA:  I have reviewed the data as listed CBC Latest Ref Rng & Units 09/08/2018 03/10/2018 09/09/2017  WBC 4.0 - 10.5 K/uL 5.3 5.3 5.8  Hemoglobin 12.0 - 15.0 g/dL 12.6 12.6 13.0  Hematocrit 36.0 - 46.0 % 38.4 37.6 40.2  Platelets 150 - 400 K/uL 315 286 316     CMP Latest Ref Rng & Units 09/08/2018 03/10/2018 09/09/2017  Glucose 70 - 99 mg/dL 90 86 84  BUN 6 - 20 mg/dL  '16 13 14  '$ Creatinine 0.44 - 1.00 mg/dL 0.85 0.84 0.81  Sodium 135 - 145 mmol/L 142 140 141  Potassium 3.5 - 5.1 mmol/L 4.9 4.2 4.5  Chloride 98 - 111 mmol/L 105 105 106  CO2 22 - 32 mmol/L '28 28 29  '$ Calcium 8.9 - 10.3 mg/dL 9.9 9.6 9.6  Total Protein 6.5 - 8.1 g/dL 7.5 7.4 7.6  Total Bilirubin 0.3 - 1.2 mg/dL 0.3 0.3 0.2  Alkaline Phos 38 - 126 U/L 52 61 63  AST 15 - 41 U/L '21 23 20  '$ ALT 0 - 44 U/L '16 16 17      '$ RADIOGRAPHIC STUDIES: I have personally reviewed the radiological images as listed and agreed with the findings in the report. No results found.   ASSESSMENT & PLAN:  MCKENZEE BEEM is a 63 y.o. female with   1. Right breast DCIS with necrosis, high-grade DCIS, ER+ /PR+ -She was diagnosed initially with DCIS in right breast in 2009. She was treated with right breast lumpectomy, radiation and declined anti-estrogen therapy.  -Unfortunately, she had DCIS recurrence in the right breast diagnosed in 05/2016. She was then treated with right breast mastectomy.  -Due to her recurrent breast cancer, and family history of multiple cancers, she underwent genetic testing and it was negative  -She started adjuvant anti-estrogen therapy with anastrozole in 09/2016. She is tolerating very well, we'll continue for 5 years. -She is clinically doing well. Lab reviewed, her CBC WNL and CMP still pending. Her physical exam and her 04/2018 mammogram were unremarkable. There is no clinical concern for recurrence. -Next Breast MRI scheduled for 11/06/18 -Continue Anastrozole  -F/u in 7 months and continue to follow up with other physicians in interim.     2. Bone health -Her PCP checks her vitamin D every year, it was normal in 12/2016 -Bone Density Scan from 01/30/17 is normal. -Continue calcium and vitamin D supplement, and exercise -Next Dexa in 01/2019 with gyn   3. Arthralgia  -Resolved now. She currently has no joint pain. She will continue on Anastrozole.   Plan  -Continue  Anastrozole -screening Breast MRI on 10/17/2018, scheduled  -Lab and f/u in 7 months    No problem-specific Assessment & Plan notes found for this encounter.   No orders of the defined types were placed in this encounter.  All questions were answered. The patient knows to call the clinic with any problems, questions or concerns. No barriers to learning was detected. I spent 15 minutes counseling the patient face to face. The total time spent in  the appointment was 20 minutes and more than 50% was on counseling and review of test results     Truitt Merle, MD 09/08/2018   I, Joslyn Devon, am acting as scribe for Truitt Merle, MD.   I have reviewed the above documentation for accuracy and completeness, and I agree with the above.

## 2018-09-08 ENCOUNTER — Encounter: Payer: Self-pay | Admitting: Hematology

## 2018-09-08 ENCOUNTER — Inpatient Hospital Stay: Payer: BC Managed Care – PPO | Attending: Hematology | Admitting: Hematology

## 2018-09-08 ENCOUNTER — Inpatient Hospital Stay: Payer: BC Managed Care – PPO

## 2018-09-08 ENCOUNTER — Telehealth: Payer: Self-pay | Admitting: Hematology

## 2018-09-08 VITALS — BP 123/70 | HR 72 | Temp 98.6°F | Resp 17 | Ht 69.0 in | Wt 172.0 lb

## 2018-09-08 DIAGNOSIS — D0511 Intraductal carcinoma in situ of right breast: Secondary | ICD-10-CM

## 2018-09-08 DIAGNOSIS — Z79811 Long term (current) use of aromatase inhibitors: Secondary | ICD-10-CM | POA: Insufficient documentation

## 2018-09-08 DIAGNOSIS — Z79899 Other long term (current) drug therapy: Secondary | ICD-10-CM | POA: Insufficient documentation

## 2018-09-08 DIAGNOSIS — Z9011 Acquired absence of right breast and nipple: Secondary | ICD-10-CM

## 2018-09-08 DIAGNOSIS — Z853 Personal history of malignant neoplasm of breast: Secondary | ICD-10-CM | POA: Insufficient documentation

## 2018-09-08 DIAGNOSIS — Z923 Personal history of irradiation: Secondary | ICD-10-CM | POA: Diagnosis not present

## 2018-09-08 DIAGNOSIS — Z170 Estrogen receptor positive status [ER+]: Secondary | ICD-10-CM | POA: Diagnosis not present

## 2018-09-08 LAB — COMPREHENSIVE METABOLIC PANEL
ALT: 16 U/L (ref 0–44)
ANION GAP: 9 (ref 5–15)
AST: 21 U/L (ref 15–41)
Albumin: 4 g/dL (ref 3.5–5.0)
Alkaline Phosphatase: 52 U/L (ref 38–126)
BUN: 16 mg/dL (ref 6–20)
CHLORIDE: 105 mmol/L (ref 98–111)
CO2: 28 mmol/L (ref 22–32)
Calcium: 9.9 mg/dL (ref 8.9–10.3)
Creatinine, Ser: 0.85 mg/dL (ref 0.44–1.00)
GFR calc non Af Amer: >60 mL/min (ref >60)
Glucose, Bld: 90 mg/dL (ref 70–99)
Potassium: 4.9 mmol/L (ref 3.5–5.1)
Sodium: 142 mmol/L (ref 135–145)
Total Bilirubin: 0.3 mg/dL (ref 0.3–1.2)
Total Protein: 7.5 g/dL (ref 6.5–8.1)

## 2018-09-08 LAB — CBC WITH DIFFERENTIAL/PLATELET
ABS IMMATURE GRANULOCYTES: 0 10*3/uL (ref 0.00–0.07)
BASOS ABS: 0 10*3/uL (ref 0.0–0.1)
Basophils Relative: 0 %
Eosinophils Absolute: 0.2 10*3/uL (ref 0.0–0.5)
Eosinophils Relative: 3 %
HCT: 38.4 % (ref 36.0–46.0)
HEMOGLOBIN: 12.6 g/dL (ref 12.0–15.0)
IMMATURE GRANULOCYTES: 0 %
LYMPHS ABS: 1.9 10*3/uL (ref 0.7–4.0)
LYMPHS PCT: 36 %
MCH: 28.9 pg (ref 26.0–34.0)
MCHC: 32.8 g/dL (ref 30.0–36.0)
MCV: 88.1 fL (ref 80.0–100.0)
Monocytes Absolute: 0.3 10*3/uL (ref 0.1–1.0)
Monocytes Relative: 6 %
NEUTROS PCT: 55 %
NRBC: 0 % (ref 0.0–0.2)
Neutro Abs: 2.9 10*3/uL (ref 1.7–7.7)
Platelets: 315 10*3/uL (ref 150–400)
RBC: 4.36 MIL/uL (ref 3.87–5.11)
RDW: 13.2 % (ref 11.5–15.5)
WBC: 5.3 10*3/uL (ref 4.0–10.5)

## 2018-09-08 NOTE — Telephone Encounter (Signed)
Scheduled appt per 01/27 los (patient stated she did not need labs, she was going to bring her lab work from her primary care).

## 2018-09-11 ENCOUNTER — Encounter: Payer: Self-pay | Admitting: Hematology

## 2018-09-18 ENCOUNTER — Telehealth: Payer: Self-pay

## 2018-09-18 NOTE — Telephone Encounter (Signed)
Dropped a copy of patient's lab work results in the mail to her home address.

## 2018-11-06 ENCOUNTER — Other Ambulatory Visit: Payer: BC Managed Care – PPO

## 2018-11-12 ENCOUNTER — Other Ambulatory Visit: Payer: BC Managed Care – PPO

## 2018-11-14 ENCOUNTER — Encounter: Payer: Self-pay | Admitting: Hematology

## 2018-11-18 ENCOUNTER — Other Ambulatory Visit: Payer: Self-pay

## 2018-11-18 ENCOUNTER — Ambulatory Visit
Admission: RE | Admit: 2018-11-18 | Discharge: 2018-11-18 | Disposition: A | Discharge status: Home / Self Care | Payer: BC Managed Care – PPO | Source: Ambulatory Visit | Attending: Hematology | Admitting: Hematology

## 2018-11-18 DIAGNOSIS — D0511 Intraductal carcinoma in situ of right breast: Secondary | ICD-10-CM

## 2018-11-18 MED ORDER — GADOBUTROL 1 MMOL/ML IV SOLN
7.0000 mL | Freq: Once | INTRAVENOUS | Status: AC | PRN
  Administered 2018-11-18: 11:00:00 7 mL via INTRAVENOUS

## 2018-11-22 ENCOUNTER — Encounter: Payer: Self-pay | Admitting: Hematology

## 2018-11-24 ENCOUNTER — Other Ambulatory Visit: Payer: Self-pay

## 2018-11-24 MED ORDER — ANASTROZOLE 1 MG PO TABS: 1.0000 mg | ORAL_TABLET | Freq: Every day | ORAL | 2 refills | Status: DC

## 2018-12-02 ENCOUNTER — Other Ambulatory Visit: Payer: BC Managed Care – PPO

## 2018-12-02 ENCOUNTER — Encounter: Payer: Self-pay | Admitting: Hematology

## 2018-12-03 ENCOUNTER — Telehealth: Payer: Self-pay

## 2018-12-03 NOTE — Telephone Encounter (Signed)
Faxed letter from Dr. Burr Medico regarding DEXA scan to Dr. Julien Girt (GYN) office.

## 2019-03-11 ENCOUNTER — Other Ambulatory Visit: Payer: Self-pay | Admitting: Hematology

## 2019-03-11 DIAGNOSIS — Z1231 Encounter for screening mammogram for malignant neoplasm of breast: Secondary | ICD-10-CM

## 2019-04-08 NOTE — Progress Notes (Signed)
[facility]   Telephone:(336) [phone] Fax:(336) [phone]   Clinic Follow up Note   Patient Care Team: Kim Pearson, MD as PCP - General (Obstetrics and Gynecology) Kim Seltzer, MD as Consulting Physician (General Surgery) Kim Merle, MD as Consulting Physician (Hematology) Kim Bison Charlestine Massed, NP as Nurse Practitioner (Hematology and Oncology)  Date of Service:  04/09/2019  CHIEF COMPLAINT: Follow up recurrent right breast DCIS  SUMMARY OF ONCOLOGIC HISTORY: Oncology History Overview Note  Breast cancer of upper-outer quadrant of right female breast [facility])   Staging form: Breast, AJCC 7th Edition   - Clinical stage from 05/15/2016: Stage 0 (Tis (DCIS), N0, M0) - Signed by Kim Merle, MD on 06/18/2016   - Pathologic stage from 07/09/2016: Stage 0 (Tis (DCIS), N0, cM0) - Signed by Kim Merle, MD on 07/30/2016  Cancer of lower-outer quadrant of female breast right TisNoMo S/P lumpetomy, radiation, declined hormones 04/2008   Staging form: Breast, AJCC 7th Edition   - Clinical stage from 04/21/2008: Stage 0 (Tis (DCIS), N0, M0) - Signed by Kim Merle, MD on 06/18/2016    Cancer of lower-outer quadrant of female breast right TisNoMo S/P lumpetomy, radiation, declined hormones 04/2008  05/16/2011 Initial Diagnosis   Cancer of lower-outer quadrant of female breast right TisNoMo S/P lumpetomy, radiation, declined hormones 04/2008   Ductal carcinoma in situ (DCIS) of right breast  04/20/2016 Mammogram   Screening mammogram showed no evidence of malignancy. Expected post lumpectomy change in the right breast.   04/30/2016 Imaging   Bilateral breast MRI with and without contrast showed new enhancement measuring 2.3 x 1.2 x 0.8 cm in the upper outer quadrant of the right lumpectomy site and extending anteriorly, suspicious for recurrent DCIS. Interval increase in number and size of level I, 2 and 3 right axillary lymph node, largest 1 cm.   05/09/2016 Imaging   Ultrasound of the  right breast and axilla showed no sonographic abnormality, no suspicious adenopathy by ultrasound. Moderately prominent lymph nodes may be reactive and related to recent flu vaccine.   05/15/2016 Initial Biopsy   MRI guided right breast core needle biopsy showed DCIS with calcifications, intermediate to high-grade.   05/15/2016 Receptors her2   ER 100% positive, PR 100% positive   05/15/2016 Initial Diagnosis   Breast cancer of upper-outer quadrant of right female breast ([place])   07/09/2016 Surgery   Right mastectomy and SLN biopsy    07/09/2016 Pathology Results   Right mastectomy showed DCIS with necrosis and calcification, high grade,  2 cm, recurrent, margins were negative, 2 sentinel lymph nodes were negative.   09/2016 -  Anti-estrogen oral therapy   Anastrozole 1 mg daily    04/29/2017 Mammogram   IMPRESSION: No suspicious areas of enhancement identified within the left breast.      CURRENT THERAPY:  Anastrozole 21m daily started 09/2016  INTERVAL HISTORY:  Kim BOSSIis here for a follow up right breast DCIS. She was last seen by me 7 months ago. She presents to the clinic alone. She notes she is doing well. She brought her labs from her PCP in 02/2019.  She notes for her MRI she paid $700 out of pocket because the other part was covered by insurance.  She notes she is tolerating Anastrozole well.   REVIEW OF SYSTEMS:   Constitutional: Denies fevers, chills or abnormal weight loss Eyes: Denies blurriness of vision Ears, nose, mouth, throat, and face: Denies mucositis or sore throat Respiratory: Denies cough, dyspnea or wheezes Cardiovascular:  Denies palpitation, chest discomfort or lower extremity swelling Gastrointestinal:  Denies nausea, heartburn or change in bowel habits Skin: Denies abnormal skin rashes Lymphatics: Denies new lymphadenopathy or easy bruising Neurological:Denies numbness, tingling or new weaknesses Behavioral/Psych: Mood is stable, no new  changes  All other systems were reviewed with the patient and are negative.  MEDICAL HISTORY:  Past Medical History:  Diagnosis Date  . Arthritis    "mild; thumb joints" (07/09/2016)  . Blood transfusion 1986   "related to ruptured ectopic pregnancy"  . Breast cancer, right breast ([place]) 2009; 06/2016  . Ectopic pregnancy 1986   ruptured  . Family history of breast cancer   . Family history of colon cancer   . Family history of pancreatic cancer   . Family history of prostate cancer   . Hypothyroidism   . Migraine    "~ twice/year" (07/09/2016)  . Pneumonia ~ 2007   "walking pneumonia"  . Thyroid disease    hypothyroism    SURGICAL HISTORY: Past Surgical History:  Procedure Laterality Date  . BREAST BIOPSY Right 2009; 05/2016  . BREAST LUMPECTOMY Right 2009  . ECTOPIC PREGNANCY SURGERY  1986  . MASTECTOMY COMPLETE / SIMPLE W/ SENTINEL NODE BIOPSY Right 07/09/2016  . MASTECTOMY W/ SENTINEL NODE BIOPSY Right 07/09/2016   Procedure: RIGHT BREAST MASTECTOMY WITH RIGHT SENTINEL LYMPH NODE BIOPSY;  Surgeon: Kim Seltzer, MD;  Location: [place];  Service: General;  Laterality: Right;    I have reviewed the social history and family history with the patient and they are unchanged from previous note.  ALLERGIES:  is allergic to vicodin [hydrocodone-acetaminophen].  MEDICATIONS:  Current Outpatient Medications  Medication Sig Dispense Refill  . anastrozole (ARIMIDEX) 1 MG tablet Take 1 tablet (1 mg total) by mouth daily. 90 tablet 2  . Calcium Carbonate-Vitamin D (CALTRATE 600+D PO) Take 1 tablet by mouth daily at 12 noon.     . Calcium-Vitamin D-Vitamin K (VIACTIV) [phone] MG-UNT-MCG CHEW Chew 1 tablet by mouth daily at 12 noon.    Marland Kitchen ibuprofen (MOTRIN IB) 200 MG tablet Take 200 mg by mouth every 8 (eight) hours as needed (for pain.).    Marland Kitchen levothyroxine (SYNTHROID, LEVOTHROID) 88 MCG tablet Take 88 mcg by mouth daily before breakfast.     . Multiple Vitamins-Minerals  (CENTRUM SILVER PO) Take 1 tablet by mouth daily at 12 noon.      No current facility-administered medications for this visit.     PHYSICAL EXAMINATION: ECOG PERFORMANCE STATUS: 0 - Asymptomatic  Vitals:   04/09/19 1011  BP: 132/85  Pulse: 72  Resp: 18  Temp: 98.2 F (36.8 C)  SpO2: 100%   Filed Weights   04/09/19 1011  Weight: 168 lb 3.2 oz (76.3 kg)    GENERAL:alert, no distress and comfortable SKIN: skin color, texture, turgor are normal, no rashes or significant lesions EYES: normal, Conjunctiva are pink and non-injected, sclera clear  NECK: supple, thyroid normal size, non-tender, without nodularity LYMPH:  no palpable lymphadenopathy in the cervical, axillary  LUNGS: clear to auscultation and percussion with normal breathing effort HEART: regular rate & rhythm and no murmurs and no lower extremity edema ABDOMEN:abdomen soft, non-tender and normal bowel sounds Musculoskeletal:no cyanosis of digits and no clubbing  NEURO: alert & oriented x 3 with fluent speech, no focal motor/sensory deficits BREAST: S/p right mastectomy: Surgical incision healed well with mild scar tissue. No palpable mass, nodules or adenopathy bilaterally. Breast exam benign.   LABORATORY DATA:  I have reviewed the  data as listed CBC Latest Ref Rng & Units 09/08/2018 03/10/2018 09/09/2017  WBC 4.0 - 10.5 K/uL 5.3 5.3 5.8  Hemoglobin 12.0 - 15.0 g/dL 12.6 12.6 13.0  Hematocrit 36.0 - 46.0 % 38.4 37.6 40.2  Platelets 150 - 400 K/uL 315 286 316     CMP Latest Ref Rng & Units 09/08/2018 03/10/2018 09/09/2017  Glucose 70 - 99 mg/dL 90 86 84  BUN 6 - 20 mg/dL _0 Creatinine 0.44 - 1.00 mg/dL 0.85 0.84 0.81  Sodium 135 - 145 mmol/L 142 140 141  Potassium 3.5 - 5.1 mmol/L 4.9 4.2 4.5  Chloride 98 - 111 mmol/L 105 105 106  CO2 22 - 32 mmol/L _1 Calcium 8.9 - 10.3 mg/dL 9.9 9.6 9.6  Total Protein 6.5 - 8.1 g/dL 7.5 7.4 7.6  Total Bilirubin 0.3 - 1.2 mg/dL 0.3 0.3 0.2  Alkaline Phos 38 - 126  U/L 52 61 63  AST 15 - 41 U/L _2 ALT 0 - 44 U/L _3 RADIOGRAPHIC STUDIES: I have personally reviewed the radiological images as listed and agreed with the findings in the report. No results found.   ASSESSMENT & PLAN:  Kim Webster is a 63 y.o. female with   1. Right breast DCIS with necrosis, high-grade DCIS, ER+ /PR+ -She was diagnosed initially with DCIS in right breast in 2009. She was treated with right breast lumpectomy, radiation and declined anti-estrogen therapy.  -Unfortunately, she had DCIS recurrence in the right breast diagnosed in 05/2016. She was then treated with right breast mastectomy.  -Due to her recurrent breast cancer, and family history of multiple cancers, she underwent genetic testing and it was negative  -She started adjuvant anti-estrogen therapy with anastrozole in 09/2016. She is tolerating very well, we'll continue for 5 years. -She is clinically doing well. Lab from PCP in 02/2019 shows improved cholesterol, Vitamin D, Kidney and liver functions are normal. Her physical exam was unremarkable today. There is no clinical concern for recurrence.  -Her 04/2018 mammogram and  Breast MRI in April 2020 were normal.  -Continue surveillance. Next mammogram in 04/2019 and next breast MRI in 11/2019 -Continue Anastrozole  -F/u in 6 months then yearly     2. Osteopenia  -DEXA on 03/2713 was normal (T-score -0.5 at [facility]). DEXA on 01/30/17 was normal (T-score -0.6 at [facility]). -02/02/2019 DEXA with gyn shows osteopenia (T-score -1.4 at [facility]) -I discussed anastrozole can weaken her bone density. I strongly encouraged her to continue oral Vitamin D and Calcium. I also encouraged her to do weight bearing exercise.    3. Arthralgia  -Resolved now.She currently has no joint pain. She will continue on Anastrozole.   Plan  -She is clinically doing well  -Continue Anastrozole -Mammogram on 04/29/19 -Lab and f/u in 6 months, will order  breast MRI on next visit    No problem-specific Assessment & Plan notes found for this encounter.   No orders of the defined types were placed in this encounter.  All questions were answered. The patient knows to call the clinic with any problems, questions or concerns. No barriers to learning was detected. I spent 15 minutes counseling the patient face to face. The total time spent in the appointment was 20 minutes and more than 50% was on counseling and review of test results     Kim Merle, MD 04/09/2019   I, Joslyn Devon, am acting  as scribe for Kim Merle, MD.   I have reviewed the above documentation for accuracy and completeness, and I agree with the above.

## 2019-04-09 ENCOUNTER — Telehealth: Payer: Self-pay | Admitting: Hematology

## 2019-04-09 ENCOUNTER — Other Ambulatory Visit: Payer: Self-pay

## 2019-04-09 ENCOUNTER — Inpatient Hospital Stay: Payer: BC Managed Care – PPO | Attending: Hematology | Admitting: Hematology

## 2019-04-09 ENCOUNTER — Encounter: Payer: Self-pay | Admitting: Hematology

## 2019-04-09 VITALS — BP 132/85 | HR 72 | Temp 98.2°F | Resp 18 | Ht 69.0 in | Wt 168.2 lb

## 2019-04-09 DIAGNOSIS — Z853 Personal history of malignant neoplasm of breast: Secondary | ICD-10-CM | POA: Insufficient documentation

## 2019-04-09 DIAGNOSIS — Z885 Allergy status to narcotic agent status: Secondary | ICD-10-CM | POA: Insufficient documentation

## 2019-04-09 DIAGNOSIS — Z170 Estrogen receptor positive status [ER+]: Secondary | ICD-10-CM | POA: Diagnosis not present

## 2019-04-09 DIAGNOSIS — Z800 Family history of malignant neoplasm of digestive organs: Secondary | ICD-10-CM | POA: Diagnosis not present

## 2019-04-09 DIAGNOSIS — M8580 Other specified disorders of bone density and structure, unspecified site: Secondary | ICD-10-CM | POA: Insufficient documentation

## 2019-04-09 DIAGNOSIS — D0511 Intraductal carcinoma in situ of right breast: Secondary | ICD-10-CM | POA: Diagnosis not present

## 2019-04-09 DIAGNOSIS — Z79811 Long term (current) use of aromatase inhibitors: Secondary | ICD-10-CM | POA: Diagnosis not present

## 2019-04-09 DIAGNOSIS — Z803 Family history of malignant neoplasm of breast: Secondary | ICD-10-CM | POA: Insufficient documentation

## 2019-04-09 DIAGNOSIS — M1990 Unspecified osteoarthritis, unspecified site: Secondary | ICD-10-CM | POA: Diagnosis not present

## 2019-04-09 DIAGNOSIS — Z9011 Acquired absence of right breast and nipple: Secondary | ICD-10-CM | POA: Diagnosis not present

## 2019-04-09 DIAGNOSIS — Z8042 Family history of malignant neoplasm of prostate: Secondary | ICD-10-CM | POA: Insufficient documentation

## 2019-04-09 DIAGNOSIS — C50411 Malignant neoplasm of upper-outer quadrant of right female breast: Secondary | ICD-10-CM | POA: Diagnosis present

## 2019-04-09 NOTE — Telephone Encounter (Deleted)
No los per 8/26. °

## 2019-04-09 NOTE — Telephone Encounter (Signed)
Put wrong provider, suppose to be FENG.  Scheduled appt per 8/27 los.  Spoke with patient and she is aware of her appt date and time.,

## 2019-04-29 ENCOUNTER — Ambulatory Visit
Admission: RE | Admit: 2019-04-29 | Discharge: 2019-04-29 | Disposition: A | Discharge status: Home / Self Care | Payer: BC Managed Care – PPO | Source: Ambulatory Visit | Attending: Hematology | Admitting: Hematology

## 2019-04-29 ENCOUNTER — Other Ambulatory Visit: Payer: Self-pay

## 2019-04-29 DIAGNOSIS — Z1231 Encounter for screening mammogram for malignant neoplasm of breast: Secondary | ICD-10-CM

## 2019-06-26 ENCOUNTER — Other Ambulatory Visit: Payer: Self-pay | Admitting: Otolaryngology

## 2019-06-26 DIAGNOSIS — H9122 Sudden idiopathic hearing loss, left ear: Secondary | ICD-10-CM

## 2019-07-01 ENCOUNTER — Other Ambulatory Visit: Payer: Self-pay | Admitting: Otolaryngology

## 2019-07-02 ENCOUNTER — Ambulatory Visit
Admission: RE | Admit: 2019-07-02 | Discharge: 2019-07-02 | Disposition: A | Discharge status: Home / Self Care | Payer: BC Managed Care – PPO | Source: Ambulatory Visit | Attending: Otolaryngology | Admitting: Otolaryngology

## 2019-07-02 ENCOUNTER — Other Ambulatory Visit: Payer: Self-pay

## 2019-07-02 DIAGNOSIS — H9122 Sudden idiopathic hearing loss, left ear: Secondary | ICD-10-CM

## 2019-07-02 MED ORDER — GADOBENATE DIMEGLUMINE 529 MG/ML IV SOLN
15.0000 mL | Freq: Once | INTRAVENOUS | Status: AC | PRN
  Administered 2019-07-02: 15 mL via INTRAVENOUS

## 2019-07-29 ENCOUNTER — Other Ambulatory Visit: Payer: Self-pay | Admitting: Hematology

## 2019-09-21 ENCOUNTER — Ambulatory Visit: Payer: BC Managed Care – PPO | Attending: Internal Medicine

## 2019-09-21 DIAGNOSIS — Z23 Encounter for immunization: Secondary | ICD-10-CM | POA: Insufficient documentation

## 2019-09-21 NOTE — Progress Notes (Signed)
   Covid-19 Vaccination Clinic  Name:  BREEAN DRAVES    MRN: [id number] DOB: 09/04/1955  09/21/2019  Ms. Mone was observed post Covid-19 immunization for 15 minutes without incidence. She was provided with Vaccine Information Sheet and instruction to access the V-Safe system.   Ms. Pettrey was instructed to call 911 with any severe reactions post vaccine: Marland Kitchen Difficulty breathing  . Swelling of your face and throat  . A fast heartbeat  . A bad rash all over your body  . Dizziness and weakness    Immunizations Administered    Name Date Dose VIS Date Route   Pfizer COVID-19 Vaccine 09/21/2019  2:31 PM 0.3 mL 07/24/2019 Intramuscular   Manufacturer: [place]   Lot: [id number]   [place]: [id number]

## 2019-10-01 ENCOUNTER — Telehealth: Payer: Self-pay | Admitting: Hematology

## 2019-10-01 NOTE — Telephone Encounter (Signed)
Per 2/17 sch msg. Pt request to keep appt and come in for labs and ov on 2/25

## 2019-10-05 NOTE — Progress Notes (Signed)
[place]   Telephone:(336) [phone] Fax:(336) [phone]   Clinic Follow up Note   Patient Care Team: Marylynn Pearson, MD as PCP - General (Obstetrics and Gynecology) Excell Seltzer, MD (Inactive) as Consulting Physician (General Surgery) Truitt Merle, MD as Consulting Physician (Hematology) Delice Bison Charlestine Massed, NP as Nurse Practitioner (Hematology and Oncology)  Date of Service:  10/08/2019  CHIEF COMPLAINT:  Follow up recurrent right breast DCIS  SUMMARY OF ONCOLOGIC HISTORY: Oncology History Overview Note  Breast cancer of upper-outer quadrant of right female breast [facility])   Staging form: Breast, AJCC 7th Edition   - Clinical stage from 05/15/2016: Stage 0 (Tis (DCIS), N0, M0) - Signed by Truitt Merle, MD on 06/18/2016   - Pathologic stage from 07/09/2016: Stage 0 (Tis (DCIS), N0, cM0) - Signed by Truitt Merle, MD on 07/30/2016  Cancer of lower-outer quadrant of female breast right TisNoMo S/P lumpetomy, radiation, declined hormones 04/2008   Staging form: Breast, AJCC 7th Edition   - Clinical stage from 04/21/2008: Stage 0 (Tis (DCIS), N0, M0) - Signed by Truitt Merle, MD on 06/18/2016    Cancer of lower-outer quadrant of female breast right TisNoMo S/P lumpetomy, radiation, declined hormones 04/2008  05/16/2011 Initial Diagnosis   Cancer of lower-outer quadrant of female breast right TisNoMo S/P lumpetomy, radiation, declined hormones 04/2008   Ductal carcinoma in situ (DCIS) of right breast  04/20/2016 Mammogram   Screening mammogram showed no evidence of malignancy. Expected post lumpectomy change in the right breast.   04/30/2016 Imaging   Bilateral breast MRI with and without contrast showed new enhancement measuring 2.3 x 1.2 x 0.8 cm in the upper outer quadrant of the right lumpectomy site and extending anteriorly, suspicious for recurrent DCIS. Interval increase in number and size of level I, 2 and 3 right axillary lymph node, largest 1 cm.   05/09/2016 Imaging   Ultrasound of the right breast and axilla showed no sonographic abnormality, no suspicious adenopathy by ultrasound. Moderately prominent lymph nodes may be reactive and related to recent flu vaccine.   05/15/2016 Initial Biopsy   MRI guided right breast core needle biopsy showed DCIS with calcifications, intermediate to high-grade.   05/15/2016 Receptors her2   ER 100% positive, PR 100% positive   05/15/2016 Initial Diagnosis   Breast cancer of upper-outer quadrant of right female breast ([place])   07/09/2016 Surgery   Right mastectomy and SLN biopsy    07/09/2016 Pathology Results   Right mastectomy showed DCIS with necrosis and calcification, high grade,  2 cm, recurrent, margins were negative, 2 sentinel lymph nodes were negative.   09/2016 -  Anti-estrogen oral therapy   Anastrozole 1 mg daily    04/29/2017 Mammogram   IMPRESSION: No suspicious areas of enhancement identified within the left breast.      CURRENT THERAPY:  Anastrozole '1mg'$  daily started 09/2016  INTERVAL HISTORY:  Kim Webster is here for a follow up of right breast DCIS. She was last seen by me 6 months ago. She presents to the clinic alone. She notes she is doing well. She notes no new changes since last visit. She notes she is tolerating Anastrozole well. She denies joint pain. I reviewed her medication list with her. She notes her activity level is moderate, she will go for walks. She sees her PCP in July and her gyn in January.     REVIEW OF SYSTEMS:   Constitutional: Denies fevers, chills or abnormal weight loss Eyes: Denies blurriness of vision Ears, nose,  mouth, throat, and face: Denies mucositis or sore throat Respiratory: Denies cough, dyspnea or wheezes Cardiovascular: Denies palpitation, chest discomfort or lower extremity swelling Gastrointestinal:  Denies nausea, heartburn or change in bowel habits Skin: Denies abnormal skin rashes Lymphatics: Denies new lymphadenopathy or easy  bruising Neurological:Denies numbness, tingling or new weaknesses Behavioral/Psych: Mood is stable, no new changes  All other systems were reviewed with the patient and are negative.  MEDICAL HISTORY:  Past Medical History:  Diagnosis Date  . Arthritis    "mild; thumb joints" (07/09/2016)  . Blood transfusion 1986   "related to ruptured ectopic pregnancy"  . Breast cancer, right breast ([place]) 2009; 06/2016  . Ectopic pregnancy 1986   ruptured  . Family history of breast cancer   . Family history of colon cancer   . Family history of pancreatic cancer   . Family history of prostate cancer   . Hypothyroidism   . Migraine    "~ twice/year" (07/09/2016)  . Personal history of radiation therapy 2009  . Pneumonia ~ 2007   "walking pneumonia"  . Thyroid disease    hypothyroism    SURGICAL HISTORY: Past Surgical History:  Procedure Laterality Date  . BREAST BIOPSY Right 2009; 05/2016  . BREAST LUMPECTOMY Right 2009  . ECTOPIC PREGNANCY SURGERY  1986  . MASTECTOMY COMPLETE / SIMPLE W/ SENTINEL NODE BIOPSY Right 07/09/2016  . MASTECTOMY W/ SENTINEL NODE BIOPSY Right 07/09/2016   Procedure: RIGHT BREAST MASTECTOMY WITH RIGHT SENTINEL LYMPH NODE BIOPSY;  Surgeon: Excell Seltzer, MD;  Location: [place];  Service: General;  Laterality: Right;    I have reviewed the social history and family history with the patient and they are unchanged from previous note.  ALLERGIES:  is allergic to vicodin [hydrocodone-acetaminophen].  MEDICATIONS:  Current Outpatient Medications  Medication Sig Dispense Refill  . anastrozole (ARIMIDEX) 1 MG tablet Take 1 tablet (1 mg total) by mouth daily. 90 tablet 3  . Calcium Carbonate-Vitamin D (CALTRATE 600+D PO) Take 1 tablet by mouth daily at 12 noon.     . Calcium-Vitamin D-Vitamin K (VIACTIV) 315-176-16 MG-UNT-MCG CHEW Chew 1 tablet by mouth daily at 12 noon.    Marland Kitchen ibuprofen (MOTRIN IB) 200 MG tablet Take 200 mg by mouth every 8 (eight) hours as needed  (for pain.).    Marland Kitchen levothyroxine (SYNTHROID, LEVOTHROID) 88 MCG tablet Take 88 mcg by mouth daily before breakfast.     . meclizine (ANTIVERT) 25 MG tablet Take 25 mg by mouth 4 (four) times daily.    . Multiple Vitamins-Minerals (CENTRUM SILVER PO) Take 1 tablet by mouth daily at 12 noon.      No current facility-administered medications for this visit.    PHYSICAL EXAMINATION: ECOG PERFORMANCE STATUS: 0 - Asymptomatic  Vitals:   10/08/19 0843  BP: 116/72  Pulse: 78  Resp: 18  Temp: 97.8 F (36.6 C)  SpO2: 100%   Filed Weights   10/08/19 0843  Weight: 181 lb 3.2 oz (82.2 kg)    GENERAL:alert, no distress and comfortable SKIN: skin color, texture, turgor are normal, no rashes or significant lesions EYES: normal, Conjunctiva are pink and non-injected, sclera clear  NECK: supple, thyroid normal size, non-tender, without nodularity LYMPH:  no palpable lymphadenopathy in the cervical, axillary  LUNGS: clear to auscultation and percussion with normal breathing effort HEART: regular rate & rhythm and no murmurs and no lower extremity edema ABDOMEN:abdomen soft, non-tender and normal bowel sounds Musculoskeletal:no cyanosis of digits and no clubbing  NEURO: alert &  oriented x 3 with fluent speech, no focal motor/sensory deficits BREAST: s/p right mastectomy: surgical incision healed well. No palpable mass, nodules or adenopathy bilaterally. Breast exam benign.   LABORATORY DATA:  I have reviewed the data as listed CBC Latest Ref Rng & Units 10/08/2019 09/08/2018 03/10/2018  WBC 4.0 - 10.5 K/uL 5.5 5.3 5.3  Hemoglobin 12.0 - 15.0 g/dL 13.2 12.6 12.6  Hematocrit 36.0 - 46.0 % 40.8 38.4 37.6  Platelets 150 - 400 K/uL 327 315 286     CMP Latest Ref Rng & Units 10/08/2019 09/08/2018 03/10/2018  Glucose 70 - 99 mg/dL 79 90 86  BUN 8 - 23 mg/dL '17 16 13  '$ Creatinine 0.44 - 1.00 mg/dL 0.83 0.85 0.84  Sodium 135 - 145 mmol/L 141 142 140  Potassium 3.5 - 5.1 mmol/L 4.4 4.9 4.2  Chloride  98 - 111 mmol/L 105 105 105  CO2 22 - 32 mmol/L '28 28 28  '$ Calcium 8.9 - 10.3 mg/dL 9.7 9.9 9.6  Total Protein 6.5 - 8.1 g/dL 7.5 7.5 7.4  Total Bilirubin 0.3 - 1.2 mg/dL 0.4 0.3 0.3  Alkaline Phos 38 - 126 U/L 69 52 61  AST 15 - 41 U/L '23 21 23  '$ ALT 0 - 44 U/L '16 16 16      '$ RADIOGRAPHIC STUDIES: I have personally reviewed the radiological images as listed and agreed with the findings in the report. No results found.   ASSESSMENT & PLAN:  Kim Webster is a 64 y.o. female with    1. Right breast DCIS with necrosis, high-grade DCIS, ER+ /PR+ -She was diagnosedinitiallywith DCIS in right breast in 2009. She was treated with right breast lumpectomy, radiation and declined anti-estrogen therapy.  -Unfortunately, she had DCIS recurrence in the right breast diagnosed in 05/2016. She was then treated with right breast mastectomy. -Due to her recurrent breast cancer, and family history of multiple cancers, she underwent genetic testing and it was negative -Shestarted adjuvantanti-estrogen therapy withanastrozolein 09/2016. She istolerating very well, we'll continue for 5 years. -She is clinically doing well. Lab reviewed, her CBC and CMP are within normal limits. Her physical exam was unremarkable. Her 04/2019 mammogram and Breast MRI in April 2020 were normal. There is no clinical concern for recurrence. -Continue surveillance. Next mammogram in 04/2020 and next breast MRI in 11/2019 -Continue Anastrozole  -F/u in 1 year    2. Osteopenia  -DEXA on 03/2013 was normal (T-score -0.5 at [facility]). DEXA on 01/30/17 was normal (T-score -0.6 at [facility]). -02/02/2019 DEXA with gyn shows osteopenia (T-score -1.4 at [facility]) -I discussed anastrozole can weaken her bone density. I strongly encouraged her to continue oral Vitamin D and Calcium. I also encouraged her to do weight bearing exercise.    Plan  -She is clinically doing well  -Continue Anastrozole, refilled today  -Breast MRI  in 11/2019  -Mammogram in 04/2020 -Lab and F/u in 1 year    No problem-specific Assessment & Plan notes found for this encounter.   Orders Placed This Encounter  Procedures  . MR BREAST BILATERAL W WO CONTRAST INC CAD    Standing Status:   Future    Standing Expiration Date:   12/05/2020    Order Specific Question:   If indicated for the ordered procedure, I authorize the administration of contrast media per Radiology protocol    Answer:   Yes    Order Specific Question:   What is the patient's sedation requirement?    Answer:  No Sedation    Order Specific Question:   Does the patient have a pacemaker or implanted devices?    Answer:   No    Order Specific Question:   Radiology Contrast Protocol - do NOT remove file path    Answer:   [email or web address]    Order Specific Question:   Preferred imaging location?    Answer:   [facility] (table limit-550lbs)  . MM Digital Screening Unilat L    Standing Status:   Future    Standing Expiration Date:   10/07/2020    Order Specific Question:   Reason for Exam (SYMPTOM  OR DIAGNOSIS REQUIRED)    Answer:   SCREENING    Order Specific Question:   Preferred imaging location?    Answer:   [facility]   All questions were answered. The patient knows to call the clinic with any problems, questions or concerns. No barriers to learning was detected. The total time spent in the appointment was 20 minutes.     Truitt Merle, MD 10/08/2019   I, Joslyn Devon, am acting as scribe for Truitt Merle, MD.   I have reviewed the above documentation for accuracy and completeness, and I agree with the above.

## 2019-10-08 ENCOUNTER — Inpatient Hospital Stay: Payer: BC Managed Care – PPO | Admitting: Hematology

## 2019-10-08 ENCOUNTER — Encounter: Payer: Self-pay | Admitting: Hematology

## 2019-10-08 ENCOUNTER — Other Ambulatory Visit: Payer: Self-pay

## 2019-10-08 ENCOUNTER — Inpatient Hospital Stay: Payer: BC Managed Care – PPO | Attending: Hematology

## 2019-10-08 ENCOUNTER — Telehealth: Payer: Self-pay | Admitting: Hematology

## 2019-10-08 VITALS — BP 116/72 | HR 78 | Temp 97.8°F | Resp 18 | Ht 69.0 in | Wt 181.2 lb

## 2019-10-08 DIAGNOSIS — M8580 Other specified disorders of bone density and structure, unspecified site: Secondary | ICD-10-CM | POA: Diagnosis not present

## 2019-10-08 DIAGNOSIS — D0511 Intraductal carcinoma in situ of right breast: Secondary | ICD-10-CM

## 2019-10-08 DIAGNOSIS — Z8759 Personal history of other complications of pregnancy, childbirth and the puerperium: Secondary | ICD-10-CM | POA: Diagnosis not present

## 2019-10-08 DIAGNOSIS — Z8042 Family history of malignant neoplasm of prostate: Secondary | ICD-10-CM | POA: Diagnosis not present

## 2019-10-08 DIAGNOSIS — Z9011 Acquired absence of right breast and nipple: Secondary | ICD-10-CM | POA: Insufficient documentation

## 2019-10-08 DIAGNOSIS — Z170 Estrogen receptor positive status [ER+]: Secondary | ICD-10-CM | POA: Diagnosis not present

## 2019-10-08 DIAGNOSIS — Z800 Family history of malignant neoplasm of digestive organs: Secondary | ICD-10-CM | POA: Insufficient documentation

## 2019-10-08 DIAGNOSIS — Z79811 Long term (current) use of aromatase inhibitors: Secondary | ICD-10-CM | POA: Insufficient documentation

## 2019-10-08 DIAGNOSIS — Z79899 Other long term (current) drug therapy: Secondary | ICD-10-CM | POA: Insufficient documentation

## 2019-10-08 DIAGNOSIS — Z803 Family history of malignant neoplasm of breast: Secondary | ICD-10-CM | POA: Diagnosis not present

## 2019-10-08 DIAGNOSIS — Z885 Allergy status to narcotic agent status: Secondary | ICD-10-CM | POA: Diagnosis not present

## 2019-10-08 DIAGNOSIS — Z853 Personal history of malignant neoplasm of breast: Secondary | ICD-10-CM | POA: Diagnosis not present

## 2019-10-08 DIAGNOSIS — Z923 Personal history of irradiation: Secondary | ICD-10-CM | POA: Diagnosis not present

## 2019-10-08 DIAGNOSIS — Z1231 Encounter for screening mammogram for malignant neoplasm of breast: Secondary | ICD-10-CM | POA: Diagnosis not present

## 2019-10-08 LAB — COMPREHENSIVE METABOLIC PANEL
ALT: 16 U/L (ref 0–44)
AST: 23 U/L (ref 15–41)
Albumin: 4 g/dL (ref 3.5–5.0)
Alkaline Phosphatase: 69 U/L (ref 38–126)
Anion gap: 8 (ref 5–15)
BUN: 17 mg/dL (ref 8–23)
CO2: 28 mmol/L (ref 22–32)
Calcium: 9.7 mg/dL (ref 8.9–10.3)
Chloride: 105 mmol/L (ref 98–111)
Creatinine, Ser: 0.83 mg/dL (ref 0.44–1.00)
GFR calc Af Amer: >60 mL/min (ref >60)
GFR calc non Af Amer: >60 mL/min (ref >60)
Glucose, Bld: 79 mg/dL (ref 70–99)
Potassium: 4.4 mmol/L (ref 3.5–5.1)
Sodium: 141 mmol/L (ref 135–145)
Total Bilirubin: 0.4 mg/dL (ref 0.3–1.2)
Total Protein: 7.5 g/dL (ref 6.5–8.1)

## 2019-10-08 LAB — CBC WITH DIFFERENTIAL/PLATELET
Abs Immature Granulocytes: 0.01 10*3/uL (ref 0.00–0.07)
Basophils Absolute: 0 10*3/uL (ref 0.0–0.1)
Basophils Relative: 1 %
Eosinophils Absolute: 0.2 10*3/uL (ref 0.0–0.5)
Eosinophils Relative: 3 %
HCT: 40.8 % (ref 36.0–46.0)
Hemoglobin: 13.2 g/dL (ref 12.0–15.0)
Immature Granulocytes: 0 %
Lymphocytes Relative: 47 %
Lymphs Abs: 2.6 10*3/uL (ref 0.7–4.0)
MCH: 29 pg (ref 26.0–34.0)
MCHC: 32.4 g/dL (ref 30.0–36.0)
MCV: 89.7 fL (ref 80.0–100.0)
Monocytes Absolute: 0.4 10*3/uL (ref 0.1–1.0)
Monocytes Relative: 8 %
Neutro Abs: 2.3 10*3/uL (ref 1.7–7.7)
Neutrophils Relative %: 41 %
Platelets: 327 10*3/uL (ref 150–400)
RBC: 4.55 MIL/uL (ref 3.87–5.11)
RDW: 13.2 % (ref 11.5–15.5)
WBC: 5.5 10*3/uL (ref 4.0–10.5)
nRBC: 0 % (ref 0.0–0.2)

## 2019-10-08 MED ORDER — ANASTROZOLE 1 MG PO TABS: 1.0000 mg | ORAL_TABLET | Freq: Every day | ORAL | 3 refills | Status: DC

## 2019-10-08 NOTE — Telephone Encounter (Signed)
Scheduled appt per 2/25 los.  Sent a message to HIM pool to get a calendar mailed out.

## 2019-10-13 ENCOUNTER — Encounter: Payer: Self-pay | Admitting: Hematology

## 2019-10-16 ENCOUNTER — Ambulatory Visit: Payer: BC Managed Care – PPO | Attending: Internal Medicine

## 2019-10-16 DIAGNOSIS — Z23 Encounter for immunization: Secondary | ICD-10-CM | POA: Insufficient documentation

## 2019-10-16 NOTE — Progress Notes (Signed)
   Covid-19 Vaccination Clinic  Name:  Kim Webster    MRN: [id number] DOB: 1955/09/05  10/16/2019  [name] was observed post Covid-19 immunization for 15 minutes without incident. She was provided with Vaccine Information Sheet and instruction to access the V-Safe system.   [name] was instructed to call 911 with any severe reactions post vaccine: Marland Kitchen Difficulty breathing  . Swelling of face and throat  . A fast heartbeat  . A bad rash all over body  . Dizziness and weakness   Immunizations Administered    Name Date Dose VIS Date Route   Pfizer COVID-19 Vaccine 10/16/2019  9:12 AM 0.3 mL 07/24/2019 Intramuscular   Manufacturer: [place]   Lot: [id number]   [place]: [id number]

## 2019-11-20 ENCOUNTER — Other Ambulatory Visit: Payer: BC Managed Care – PPO

## 2019-11-27 ENCOUNTER — Ambulatory Visit
Admission: RE | Admit: 2019-11-27 | Discharge: 2019-11-27 | Disposition: A | Discharge status: Home / Self Care | Payer: BC Managed Care – PPO | Source: Ambulatory Visit | Attending: Hematology | Admitting: Hematology

## 2019-11-27 DIAGNOSIS — D0511 Intraductal carcinoma in situ of right breast: Secondary | ICD-10-CM

## 2019-11-27 MED ORDER — GADOBUTROL 1 MMOL/ML IV SOLN
8.0000 mL | Freq: Once | INTRAVENOUS | Status: AC | PRN
  Administered 2019-11-27: 8 mL via INTRAVENOUS

## 2020-05-09 ENCOUNTER — Ambulatory Visit
Admission: RE | Admit: 2020-05-09 | Discharge: 2020-05-09 | Disposition: A | Discharge status: Home / Self Care | Payer: BC Managed Care – PPO | Source: Ambulatory Visit | Attending: Hematology | Admitting: Hematology

## 2020-05-09 ENCOUNTER — Other Ambulatory Visit: Payer: Self-pay

## 2020-05-09 DIAGNOSIS — Z1231 Encounter for screening mammogram for malignant neoplasm of breast: Secondary | ICD-10-CM

## 2020-10-05 NOTE — Progress Notes (Signed)
Kim Webster   Telephone:([phone] Fax:(336) [phone]   Clinic Follow up Note   Patient Care Team: Marylynn Pearson, MD as PCP - General (Obstetrics and Gynecology) Excell Seltzer, MD (Inactive) as Consulting Physician (General Surgery) Truitt Merle, MD as Consulting Physician (Hematology) Delice Bison Charlestine Massed, NP as Nurse Practitioner (Hematology and Oncology)  Date of Service:  10/07/2020  CHIEF COMPLAINT: Follow up recurrent right breast DCIS  SUMMARY OF ONCOLOGIC HISTORY: Oncology History Overview Note  Breast cancer of upper-outer quadrant of right female breast [facility])   Staging form: Breast, AJCC 7th Edition   - Clinical stage from 05/15/2016: Stage 0 (Tis (DCIS), N0, M0) - Signed by Truitt Merle, MD on 06/18/2016   - Pathologic stage from 07/09/2016: Stage 0 (Tis (DCIS), N0, cM0) - Signed by Truitt Merle, MD on 07/30/2016  Cancer of lower-outer quadrant of female breast right TisNoMo S/P lumpetomy, radiation, declined hormones 04/2008   Staging form: Breast, AJCC 7th Edition   - Clinical stage from 04/21/2008: Stage 0 (Tis (DCIS), N0, M0) - Signed by Truitt Merle, MD on 06/18/2016    Cancer of lower-outer quadrant of female breast right TisNoMo S/P lumpetomy, radiation, declined hormones 04/2008  05/16/2011 Initial Diagnosis   Cancer of lower-outer quadrant of female breast right TisNoMo S/P lumpetomy, radiation, declined hormones 04/2008   Ductal carcinoma in situ (DCIS) of right breast  04/20/2016 Mammogram   Screening mammogram showed no evidence of malignancy. Expected post lumpectomy change in the right breast.   04/30/2016 Imaging   Bilateral breast MRI with and without contrast showed new enhancement measuring 2.3 x 1.2 x 0.8 cm in the upper outer quadrant of the right lumpectomy site and extending anteriorly, suspicious for recurrent DCIS. Interval increase in number and size of level I, 2 and 3 right axillary lymph node, largest 1 cm.   05/09/2016 Imaging    Ultrasound of the right breast and axilla showed no sonographic abnormality, no suspicious adenopathy by ultrasound. Moderately prominent lymph nodes may be reactive and related to recent flu vaccine.   05/15/2016 Initial Biopsy   MRI guided right breast core needle biopsy showed DCIS with calcifications, intermediate to high-grade.   05/15/2016 Receptors her2   ER 100% positive, PR 100% positive   05/15/2016 Initial Diagnosis   Breast cancer of upper-outer quadrant of right female breast ([place])   07/09/2016 Surgery   Right mastectomy and SLN biopsy    07/09/2016 Pathology Results   Right mastectomy showed DCIS with necrosis and calcification, high grade,  2 cm, recurrent, margins were negative, 2 sentinel lymph nodes were negative.   09/2016 -  Anti-estrogen oral therapy   Anastrozole 1 mg daily    04/29/2017 Mammogram   IMPRESSION: No suspicious areas of enhancement identified within the left breast.      CURRENT THERAPY:  Anastrozole 44m daily started 09/2016  INTERVAL HISTORY:  Kim CARRENOis here for a follow up of right breast cancer DCIS. She was last seen by me 1 year ago. She presents to the clinic alone. She notes this morning she was told by my nurse that she has bleeding inner right eye. She denies, vision change or ithcing or pain. She notes she is tolerating anastrozole well. She is tolerating anastrozole well. She notes she is overall stable. She notes her BM are regular. She notes she sees her Gyn once yearly and PCP Once a year.     REVIEW OF SYSTEMS:   Constitutional: Denies fevers, chills or abnormal weight  loss Eyes: Denies blurriness of vision Ears, nose, mouth, throat, and face: Denies mucositis or sore throat Respiratory: Denies cough, dyspnea or wheezes Cardiovascular: Denies palpitation, chest discomfort or lower extremity swelling Gastrointestinal:  Denies nausea, heartburn or change in bowel habits Skin: Denies abnormal skin rashes Lymphatics:  Denies new lymphadenopathy or easy bruising Neurological:Denies numbness, tingling or new weaknesses Behavioral/Psych: Mood is stable, no new changes  All other systems were reviewed with the patient and are negative.  MEDICAL HISTORY:  Past Medical History:  Diagnosis Date  . Arthritis    "mild; thumb joints" (07/09/2016)  . Blood transfusion 1986   "related to ruptured ectopic pregnancy"  . Breast cancer, right breast ([facility]) 2009; 06/2016  . Ectopic pregnancy 1986   ruptured  . Family history of breast cancer   . Family history of colon cancer   . Family history of pancreatic cancer   . Family history of prostate cancer   . Hypothyroidism   . Migraine    "~ twice/year" (07/09/2016)  . Personal history of radiation therapy 2009  . Pneumonia ~ 2007   "walking pneumonia"  . Thyroid disease    hypothyroism    SURGICAL HISTORY: Past Surgical History:  Procedure Laterality Date  . BREAST BIOPSY Right 2009; 05/2016  . BREAST LUMPECTOMY Right 2009  . ECTOPIC PREGNANCY SURGERY  1986  . MASTECTOMY COMPLETE / SIMPLE W/ SENTINEL NODE BIOPSY Right 07/09/2016  . MASTECTOMY W/ SENTINEL NODE BIOPSY Right 07/09/2016   Procedure: RIGHT BREAST MASTECTOMY WITH RIGHT SENTINEL LYMPH NODE BIOPSY;  Surgeon: Benjamin Hoxworth, MD;  Location: [facility];  Service: General;  Laterality: Right;    I have reviewed the social history and family history with the patient and they are unchanged from previous note.  ALLERGIES:  is allergic to vicodin [hydrocodone-acetaminophen].  MEDICATIONS:  Current Outpatient Medications  Medication Sig Dispense Refill  . anastrozole (ARIMIDEX) 1 MG tablet Take 1 tablet (1 mg total) by mouth daily. 90 tablet 3  . Calcium Carbonate-Vitamin D (CALTRATE 600+D PO) Take 1 tablet by mouth daily at 12 noon.     . Calcium-Vitamin D-Vitamin K (VIACTIV) 500-500-40 MG-UNT-MCG CHEW Chew 1 tablet by mouth daily at 12 noon.    . ibuprofen (MOTRIN IB) 200 MG tablet Take 200 mg by  mouth every 8 (eight) hours as needed (for pain.).    . levothyroxine (SYNTHROID, LEVOTHROID) 88 MCG tablet Take 88 mcg by mouth daily before breakfast.     . meclizine (ANTIVERT) 25 MG tablet Take 25 mg by mouth 4 (four) times daily.    . Multiple Vitamins-Minerals (CENTRUM SILVER PO) Take 1 tablet by mouth daily at 12 noon.      No current facility-administered medications for this visit.    PHYSICAL EXAMINATION: ECOG PERFORMANCE STATUS: 0 - Asymptomatic  Vitals:   10/07/20 0840  BP: 136/88  Pulse: 67  Resp: 18  Temp: 97.7 F (36.5 C)  SpO2: 100%   Filed Weights   10/07/20 0840  Weight: 191 lb 5 oz (86.8 kg)    GENERAL:alert, no distress and comfortable SKIN: skin color, texture, turgor are normal, no rashes or significant lesions EYES: normal, Conjunctiva are pink and non-injected, sclera clear NECK: supple, thyroid normal size, non-tender, without nodularity LYMPH:  no palpable lymphadenopathy in the cervical, axillary  LUNGS: clear to auscultation and percussion with normal breathing effort HEART: regular rate & rhythm and no murmurs and no lower extremity edema ABDOMEN:abdomen soft, non-tender and normal bowel sounds Musculoskeletal:no cyanosis of digits   and no clubbing  NEURO: alert & oriented x 3 with fluent speech, no focal motor/sensory deficits BREAST: S/p right breast mastectomy: Surgical incision healed well. With mild soft tissue fullness in right axillary. No palpable mass, nodules or adenopathy bilaterally. Breast exam benign.   LABORATORY DATA:  I have reviewed the data as listed CBC Latest Ref Rng & Units 10/07/2020 10/08/2019 09/08/2018  WBC 4.0 - 10.5 K/uL 6.1 5.5 5.3  Hemoglobin 12.0 - 15.0 g/dL 13.2 13.2 12.6  Hematocrit 36.0 - 46.0 % 40.2 40.8 38.4  Platelets 150 - 400 K/uL 301 327 315     CMP Latest Ref Rng & Units 10/07/2020 10/08/2019 09/08/2018  Glucose 70 - 99 mg/dL 83 79 90  BUN 8 - 23 mg/dL _0 Creatinine 0.44 - 1.00 mg/dL 0.86 0.83 0.85   Sodium 135 - 145 mmol/L 138 141 142  Potassium 3.5 - 5.1 mmol/L 3.7 4.4 4.9  Chloride 98 - 111 mmol/L 104 105 105  CO2 22 - 32 mmol/L _1 Calcium 8.9 - 10.3 mg/dL 9.9 9.7 9.9  Total Protein 6.5 - 8.1 g/dL 7.8 7.5 7.5  Total Bilirubin 0.3 - 1.2 mg/dL 0.3 0.4 0.3  Alkaline Phos 38 - 126 U/L 64 69 52  AST 15 - 41 U/L _2 ALT 0 - 44 U/L _3 RADIOGRAPHIC STUDIES: I have personally reviewed the radiological images as listed and agreed with the findings in the report. No results found.   ASSESSMENT & PLAN:  Kim Webster is a 65 y.o. female with    1. Right breast DCIS with necrosis, high-grade DCIS, ER+ /PR+ -She was diagnosedinitiallywith DCIS in right breast in 2009. She was treated with right breast lumpectomy, radiation and declined anti-estrogen therapy.  -Unfortunately, she had DCIS recurrence in the right breast diagnosed in 05/2016. She was then treated with right breast mastectomy. -Due to her recurrent breast cancer, and family history of multiple cancers, she underwent genetic testing and it was negative -Shestarted adjuvantanti-estrogen therapy withanastrozolein 09/2016. She istolerating very well, we'll continue for 5 years. -She is clinically doing well. Lab reviewed, her CBC and CMP are within normal limits. Her physical exam and her 04/2020 mammogram were unremarkable. There is no clinical concern for recurrence. -Continue surveillance. Next mammogram in 04/2021. Next MRI in 11/2020 -Continue Anastrozole for one more year  -F/u in 1 year    2.Osteopenia -DEXAon 03/2013 wasnormal (T-score -0.5 at [facility]). DEXA on 01/30/17 was normal (T-score -0.6 at [facility]). -02/02/2019 DEXAwith gynshows osteopenia(T-score -1.4 at [facility]). She is due for DEXA in 2022 with her Gyn -I discussed anastrozole can weaken her bone density. I strongly encouraged her to continue oral Vitamin D and Calcium. I also encouraged her to do weight bearing  exercise.   Plan -She is clinically doing well -Continue Anastrozole for one more year  -Mammogram and DEXA in 04/2021 -MRI Breast for cancer screening in 11/2020  -Lab and F/u in 1 year     No problem-specific Assessment & Plan notes found for this encounter.   Orders Placed This Encounter  Procedures  . MM Digital Screening Unilat L    Standing Status:   Future    Standing Expiration Date:   10/07/2021    Order Specific Question:   Reason for Exam (SYMPTOM  OR DIAGNOSIS REQUIRED)    Answer:   screening    Order Specific Question:  Preferred imaging location?    [facility]  . MR BREAST BILATERAL W WO CONTRAST INC CAD    Standing Status:   Future    Standing Expiration Date:   10/07/2021    Order Specific Question:   If indicated for the ordered procedure, I authorize the administration of contrast media per Radiology protocol    Answer:   Yes    Order Specific Question:   What is the patient's sedation requirement?    Answer:   No Sedation    Order Specific Question:   Does the patient have a pacemaker or implanted devices?    Answer:   No    Order Specific Question:   Radiology Contrast Protocol - do NOT remove file path    Answer:   [email or web address]    Order Specific Question:   Preferred imaging location?    Answer:   [facility] (table limit-550lbs)   All questions were answered. The patient knows to call the clinic with any problems, questions or concerns. No barriers to learning was detected. The total time spent in the appointment was 30 minutes.     Yan Feng, MD 10/07/2020   I, Amoya Bennett, am acting as scribe for Yan Feng, MD.   I have reviewed the above documentation for accuracy and completeness, and I agree with the above.       

## 2020-10-07 ENCOUNTER — Inpatient Hospital Stay: Payer: BC Managed Care – PPO

## 2020-10-07 ENCOUNTER — Telehealth: Payer: Self-pay | Admitting: Hematology

## 2020-10-07 ENCOUNTER — Encounter: Payer: Self-pay | Admitting: Hematology

## 2020-10-07 ENCOUNTER — Other Ambulatory Visit: Payer: Self-pay

## 2020-10-07 ENCOUNTER — Inpatient Hospital Stay: Payer: BC Managed Care – PPO | Attending: Hematology | Admitting: Hematology

## 2020-10-07 VITALS — BP 136/88 | HR 67 | Temp 97.7°F | Resp 18 | Ht 69.0 in | Wt 191.3 lb

## 2020-10-07 DIAGNOSIS — D0511 Intraductal carcinoma in situ of right breast: Secondary | ICD-10-CM

## 2020-10-07 DIAGNOSIS — M8580 Other specified disorders of bone density and structure, unspecified site: Secondary | ICD-10-CM | POA: Diagnosis not present

## 2020-10-07 DIAGNOSIS — Z803 Family history of malignant neoplasm of breast: Secondary | ICD-10-CM | POA: Diagnosis not present

## 2020-10-07 DIAGNOSIS — Z923 Personal history of irradiation: Secondary | ICD-10-CM | POA: Diagnosis not present

## 2020-10-07 DIAGNOSIS — Z885 Allergy status to narcotic agent status: Secondary | ICD-10-CM | POA: Insufficient documentation

## 2020-10-07 DIAGNOSIS — Z79811 Long term (current) use of aromatase inhibitors: Secondary | ICD-10-CM | POA: Diagnosis not present

## 2020-10-07 DIAGNOSIS — C50411 Malignant neoplasm of upper-outer quadrant of right female breast: Secondary | ICD-10-CM | POA: Insufficient documentation

## 2020-10-07 DIAGNOSIS — Z8042 Family history of malignant neoplasm of prostate: Secondary | ICD-10-CM | POA: Diagnosis not present

## 2020-10-07 DIAGNOSIS — Z79899 Other long term (current) drug therapy: Secondary | ICD-10-CM | POA: Diagnosis not present

## 2020-10-07 DIAGNOSIS — Z800 Family history of malignant neoplasm of digestive organs: Secondary | ICD-10-CM | POA: Diagnosis not present

## 2020-10-07 DIAGNOSIS — Z170 Estrogen receptor positive status [ER+]: Secondary | ICD-10-CM | POA: Diagnosis not present

## 2020-10-07 LAB — COMPREHENSIVE METABOLIC PANEL
ALT: 20 U/L (ref 0–44)
AST: 26 U/L (ref 15–41)
Albumin: 4.1 g/dL (ref 3.5–5.0)
Alkaline Phosphatase: 64 U/L (ref 38–126)
Anion gap: 8 (ref 5–15)
BUN: 22 mg/dL (ref 8–23)
CO2: 26 mmol/L (ref 22–32)
Calcium: 9.9 mg/dL (ref 8.9–10.3)
Chloride: 104 mmol/L (ref 98–111)
Creatinine, Ser: 0.86 mg/dL (ref 0.44–1.00)
GFR, Estimated: >60 mL/min (ref >60)
Glucose, Bld: 83 mg/dL (ref 70–99)
Potassium: 3.7 mmol/L (ref 3.5–5.1)
Sodium: 138 mmol/L (ref 135–145)
Total Bilirubin: 0.3 mg/dL (ref 0.3–1.2)
Total Protein: 7.8 g/dL (ref 6.5–8.1)

## 2020-10-07 LAB — CBC WITH DIFFERENTIAL/PLATELET
Abs Immature Granulocytes: 0.01 10*3/uL (ref 0.00–0.07)
Basophils Absolute: 0 10*3/uL (ref 0.0–0.1)
Basophils Relative: 1 %
Eosinophils Absolute: 0.2 10*3/uL (ref 0.0–0.5)
Eosinophils Relative: 3 %
HCT: 40.2 % (ref 36.0–46.0)
Hemoglobin: 13.2 g/dL (ref 12.0–15.0)
Immature Granulocytes: 0 %
Lymphocytes Relative: 33 %
Lymphs Abs: 2 10*3/uL (ref 0.7–4.0)
MCH: 28.6 pg (ref 26.0–34.0)
MCHC: 32.8 g/dL (ref 30.0–36.0)
MCV: 87.2 fL (ref 80.0–100.0)
Monocytes Absolute: 0.4 10*3/uL (ref 0.1–1.0)
Monocytes Relative: 7 %
Neutro Abs: 3.4 10*3/uL (ref 1.7–7.7)
Neutrophils Relative %: 56 %
Platelets: 301 10*3/uL (ref 150–400)
RBC: 4.61 MIL/uL (ref 3.87–5.11)
RDW: 13.3 % (ref 11.5–15.5)
WBC: 6.1 10*3/uL (ref 4.0–10.5)
nRBC: 0 % (ref 0.0–0.2)

## 2020-10-07 MED ORDER — ANASTROZOLE 1 MG PO TABS: 1.0000 mg | ORAL_TABLET | Freq: Every day | ORAL | 3 refills | Status: AC

## 2020-10-07 NOTE — Telephone Encounter (Signed)
Scheduled appointments per 2/25 los. Spoke to patient who is aware of appointments date and times.

## 2020-11-28 ENCOUNTER — Ambulatory Visit
Admission: RE | Admit: 2020-11-28 | Discharge: 2020-11-28 | Disposition: A | Discharge status: Home / Self Care | Payer: BC Managed Care – PPO | Source: Ambulatory Visit | Attending: Hematology | Admitting: Hematology

## 2020-11-28 DIAGNOSIS — D0511 Intraductal carcinoma in situ of right breast: Secondary | ICD-10-CM

## 2020-11-28 MED ORDER — GADOBUTROL 1 MMOL/ML IV SOLN
9.0000 mL | Freq: Once | INTRAVENOUS | Status: AC | PRN
  Administered 2020-11-28: 9 mL via INTRAVENOUS

## 2020-12-12 ENCOUNTER — Telehealth: Payer: Self-pay

## 2020-12-12 NOTE — Telephone Encounter (Signed)
Called spoke with pt and made her aware of most recent breast MRI encouraged to call for questions concerns or changes

## 2020-12-12 NOTE — Telephone Encounter (Signed)
-----   Message from Truitt Merle, MD sent at 12/11/2020 11:59 AM EDT ----- Please let pt know her screening breast MRI was normal on 11/28/2020. Thanks   Truitt Merle  12/11/2020

## 2021-02-07 ENCOUNTER — Telehealth: Payer: Self-pay | Admitting: Hematology

## 2021-02-07 NOTE — Telephone Encounter (Signed)
R/s appts per 6/28 sch msg. Pt aware.  

## 2021-05-10 ENCOUNTER — Other Ambulatory Visit: Payer: Self-pay

## 2021-05-10 ENCOUNTER — Ambulatory Visit
Admission: RE | Admit: 2021-05-10 | Discharge: 2021-05-10 | Disposition: A | Discharge status: Home / Self Care | Payer: BC Managed Care – PPO | Source: Ambulatory Visit | Attending: Hematology | Admitting: Hematology

## 2021-05-10 DIAGNOSIS — D0511 Intraductal carcinoma in situ of right breast: Secondary | ICD-10-CM

## 2021-09-28 ENCOUNTER — Telehealth: Payer: Self-pay | Admitting: Hematology

## 2021-09-28 NOTE — Telephone Encounter (Signed)
Rescheduled upcoming appointment due to provider's breast clinic. Patient is aware of changes. 

## 2021-10-09 ENCOUNTER — Other Ambulatory Visit: Payer: BC Managed Care – PPO

## 2021-10-09 ENCOUNTER — Ambulatory Visit: Payer: BC Managed Care – PPO | Admitting: Hematology

## 2021-10-12 ENCOUNTER — Encounter: Payer: Self-pay | Admitting: Hematology

## 2021-10-12 ENCOUNTER — Inpatient Hospital Stay: Payer: BC Managed Care – PPO | Admitting: Hematology

## 2021-10-12 ENCOUNTER — Inpatient Hospital Stay: Payer: BC Managed Care – PPO | Attending: Hematology

## 2021-10-12 ENCOUNTER — Other Ambulatory Visit: Payer: Self-pay

## 2021-10-12 VITALS — BP 116/68 | HR 70 | Temp 98.3°F | Resp 17 | Ht 69.0 in | Wt 192.4 lb

## 2021-10-12 DIAGNOSIS — Z1231 Encounter for screening mammogram for malignant neoplasm of breast: Secondary | ICD-10-CM

## 2021-10-12 DIAGNOSIS — Z803 Family history of malignant neoplasm of breast: Secondary | ICD-10-CM | POA: Insufficient documentation

## 2021-10-12 DIAGNOSIS — Z79899 Other long term (current) drug therapy: Secondary | ICD-10-CM | POA: Diagnosis not present

## 2021-10-12 DIAGNOSIS — E039 Hypothyroidism, unspecified: Secondary | ICD-10-CM | POA: Insufficient documentation

## 2021-10-12 DIAGNOSIS — D0511 Intraductal carcinoma in situ of right breast: Secondary | ICD-10-CM | POA: Diagnosis present

## 2021-10-12 DIAGNOSIS — Z923 Personal history of irradiation: Secondary | ICD-10-CM | POA: Diagnosis not present

## 2021-10-12 DIAGNOSIS — Z853 Personal history of malignant neoplasm of breast: Secondary | ICD-10-CM | POA: Insufficient documentation

## 2021-10-12 DIAGNOSIS — Z8042 Family history of malignant neoplasm of prostate: Secondary | ICD-10-CM | POA: Insufficient documentation

## 2021-10-12 DIAGNOSIS — Z800 Family history of malignant neoplasm of digestive organs: Secondary | ICD-10-CM | POA: Diagnosis not present

## 2021-10-12 DIAGNOSIS — Z885 Allergy status to narcotic agent status: Secondary | ICD-10-CM | POA: Insufficient documentation

## 2021-10-12 DIAGNOSIS — M8580 Other specified disorders of bone density and structure, unspecified site: Secondary | ICD-10-CM | POA: Diagnosis not present

## 2021-10-12 DIAGNOSIS — Z8759 Personal history of other complications of pregnancy, childbirth and the puerperium: Secondary | ICD-10-CM | POA: Diagnosis not present

## 2021-10-12 LAB — COMPREHENSIVE METABOLIC PANEL
ALT: 17 U/L (ref 0–44)
AST: 23 U/L (ref 15–41)
Albumin: 4.3 g/dL (ref 3.5–5.0)
Alkaline Phosphatase: 57 U/L (ref 38–126)
Anion gap: 6 (ref 5–15)
BUN: 18 mg/dL (ref 8–23)
CO2: 27 mmol/L (ref 22–32)
Calcium: 9.8 mg/dL (ref 8.9–10.3)
Chloride: 105 mmol/L (ref 98–111)
Creatinine, Ser: 0.9 mg/dL (ref 0.44–1.00)
GFR, Estimated: >60 mL/min (ref >60)
Glucose, Bld: 95 mg/dL (ref 70–99)
Potassium: 4.4 mmol/L (ref 3.5–5.1)
Sodium: 138 mmol/L (ref 135–145)
Total Bilirubin: 0.4 mg/dL (ref 0.3–1.2)
Total Protein: 7.6 g/dL (ref 6.5–8.1)

## 2021-10-12 LAB — CBC WITH DIFFERENTIAL/PLATELET
Abs Immature Granulocytes: 0.01 10*3/uL (ref 0.00–0.07)
Basophils Absolute: 0 10*3/uL (ref 0.0–0.1)
Basophils Relative: 1 %
Eosinophils Absolute: 0.2 10*3/uL (ref 0.0–0.5)
Eosinophils Relative: 3 %
HCT: 37.4 % (ref 36.0–46.0)
Hemoglobin: 12.2 g/dL (ref 12.0–15.0)
Immature Granulocytes: 0 %
Lymphocytes Relative: 33 %
Lymphs Abs: 1.8 10*3/uL (ref 0.7–4.0)
MCH: 28.6 pg (ref 26.0–34.0)
MCHC: 32.6 g/dL (ref 30.0–36.0)
MCV: 87.6 fL (ref 80.0–100.0)
Monocytes Absolute: 0.4 10*3/uL (ref 0.1–1.0)
Monocytes Relative: 7 %
Neutro Abs: 3.2 10*3/uL (ref 1.7–7.7)
Neutrophils Relative %: 56 %
Platelets: 335 10*3/uL (ref 150–400)
RBC: 4.27 MIL/uL (ref 3.87–5.11)
RDW: 13.6 % (ref 11.5–15.5)
WBC: 5.6 10*3/uL (ref 4.0–10.5)
nRBC: 0 % (ref 0.0–0.2)

## 2021-10-12 NOTE — Progress Notes (Signed)
[place]   Telephone:([phone]) [phone] Fax:([phone]) [phone]   Clinic Follow up Note   Patient Care Team: Marylynn Pearson, MD as PCP - General (Obstetrics and Gynecology) Excell Seltzer, MD (Inactive) as Consulting Physician (General Surgery) Truitt Merle, MD as Consulting Physician (Hematology) Delice Bison Charlestine Massed, NP as Nurse Practitioner (Hematology and Oncology)  Date of Service:  10/12/2021  CHIEF COMPLAINT: f/u of right breast DCIS x2  CURRENT THERAPY:  Anastrozole 31m daily started 09/2016  ASSESSMENT & PLAN:  Kim NATTis a 66y.o. female with   1. Right breast DCIS with necrosis, high-grade DCIS, ER+ /PR+ -She was diagnosed initially with DCIS in right breast in 2009. She was treated with lumpectomy and radiation. She declined antiestrogen therapy.  -Unfortunately, she had a second right breast DCIS in 05/2016. She was then treated with right breast mastectomy.  -Due to her recurrent breast cancer, and family history of multiple cancers, she underwent genetic testing, which was negative  -She started adjuvant anti-estrogen therapy with anastrozole in 09/2016. She has tolerated very well and has completed 5 years of treatment. She will finish what she has left then stop. -most recent breast MRI 11/28/20 and left mammogram 05/10/21 were negative/benign. -She is clinically doing well. Lab reviewed, her CBC and CMP are within normal limits. Her physical exam was unremarkable. There is no clinical concern for recurrence. -Continue surveillance. Next mammogram in 04/2022. Next MRI in 11/2021. At this point, I feel comfortable releasing her to f/u with her GYN. I advised her to have Dr. AJulien Girtorder her mammograms and MRI's, which I recommend she continue until age 89   2. Osteopenia  -DEXA on 01/30/17 was normal (T-score -0.6 at [facility]). Repeat DEXA on 02/02/19 with gyn shows osteopenia (T-score -1.4 at [facility]). She is due for DEXA in 2022 with her Gyn -I  discussed anastrozole can weaken her bone density. I strongly encouraged her to continue oral Vitamin D and Calcium. I also encouraged her to do weight bearing exercise.      Plan  -breast MRI due 11/2021 -left mammogram due 04/2022 -f/u open, she will f/u with her PCP and GYN     No problem-specific Assessment & Plan notes found for this encounter.   SUMMARY OF ONCOLOGIC HISTORY: Oncology History Overview Note  Breast cancer of upper-outer quadrant of right female breast ([facility]   Staging form: Breast, AJCC 7th Edition   - Clinical stage from 05/15/2016: Stage 0 (Tis (DCIS), N0, M0) - Signed by YTruitt Merle MD on 06/18/2016   - Pathologic stage from 07/09/2016: Stage 0 (Tis (DCIS), N0, cM0) - Signed by YTruitt Merle MD on 07/30/2016  Cancer of lower-outer quadrant of female breast right TisNoMo S/P lumpetomy, radiation, declined hormones 04/2008   Staging form: Breast, AJCC 7th Edition   - Clinical stage from 04/21/2008: Stage 0 (Tis (DCIS), N0, M0) - Signed by YTruitt Merle MD on 06/18/2016    Cancer of lower-outer quadrant of female breast right TisNoMo S/P lumpetomy, radiation, declined hormones 04/2008  05/16/2011 Initial Diagnosis   Cancer of lower-outer quadrant of female breast right TisNoMo S/P lumpetomy, radiation, declined hormones 04/2008   Ductal carcinoma in situ (DCIS) of right breast  04/20/2016 Mammogram   Screening mammogram showed no evidence of malignancy. Expected post lumpectomy change in the right breast.   04/30/2016 Imaging   Bilateral breast MRI with and without contrast showed new enhancement measuring 2.3 x 1.2 x 0.8 cm in the upper outer quadrant of  the right lumpectomy site and extending anteriorly, suspicious for recurrent DCIS. Interval increase in number and size of level I, 2 and 3 right axillary lymph node, largest 1 cm.   05/09/2016 Imaging   Ultrasound of the right breast and axilla showed no sonographic abnormality, no suspicious adenopathy by ultrasound. Moderately  prominent lymph nodes may be reactive and related to recent flu vaccine.   05/15/2016 Initial Biopsy   MRI guided right breast core needle biopsy showed DCIS with calcifications, intermediate to high-grade.   05/15/2016 Receptors her2   ER 100% positive, PR 100% positive   05/15/2016 Initial Diagnosis   Breast cancer of upper-outer quadrant of right female breast ([place])   07/09/2016 Surgery   Right mastectomy and SLN biopsy    07/09/2016 Pathology Results   Right mastectomy showed DCIS with necrosis and calcification, high grade,  2 cm, recurrent, margins were negative, 2 sentinel lymph nodes were negative.   09/2016 -  Anti-estrogen oral therapy   Anastrozole 1 mg daily    04/29/2017 Mammogram   IMPRESSION: No suspicious areas of enhancement identified within the left breast.      INTERVAL HISTORY:  Kim Webster is here for a follow up of breast cancer. She was last seen by me a year ago. She presents to the clinic alone. She reports she is doing well overall and denies any side effects from the anastrozole.   All other systems were reviewed with the patient and are negative.  MEDICAL HISTORY:  Past Medical History:  Diagnosis Date   Arthritis    "mild; thumb joints" (07/09/2016)   Blood transfusion 1986   "related to ruptured ectopic pregnancy"   Breast cancer, right breast ([place]) 2009; 06/2016   Ectopic pregnancy 1986   ruptured   Family history of breast cancer    Family history of colon cancer    Family history of pancreatic cancer    Family history of prostate cancer    Hypothyroidism    Migraine    "~ twice/year" (07/09/2016)   Personal history of radiation therapy 2009   Pneumonia ~ 2007   "walking pneumonia"   Thyroid disease    hypothyroism    SURGICAL HISTORY: Past Surgical History:  Procedure Laterality Date   BREAST BIOPSY Right 2009; 05/2016   BREAST LUMPECTOMY Right 2009   ECTOPIC PREGNANCY SURGERY  1986   MASTECTOMY COMPLETE / SIMPLE W/  SENTINEL NODE BIOPSY Right 07/09/2016   MASTECTOMY W/ SENTINEL NODE BIOPSY Right 07/09/2016   Procedure: RIGHT BREAST MASTECTOMY WITH RIGHT SENTINEL LYMPH NODE BIOPSY;  Surgeon: Excell Seltzer, MD;  Location: [place];  Service: General;  Laterality: Right;    I have reviewed the social history and family history with the patient and they are unchanged from previous note.  ALLERGIES:  is allergic to vicodin [hydrocodone-acetaminophen].  MEDICATIONS:  Current Outpatient Medications  Medication Sig Dispense Refill   anastrozole (ARIMIDEX) 1 MG tablet Take 1 tablet (1 mg total) by mouth daily. 90 tablet 3   Calcium Carbonate-Vitamin D (CALTRATE 600+D PO) Take 1 tablet by mouth daily at 12 noon.      Calcium-Vitamin D-Vitamin K (VIACTIV) [phone] MG-UNT-MCG CHEW Chew 1 tablet by mouth daily at 12 noon.     ibuprofen (MOTRIN IB) 200 MG tablet Take 200 mg by mouth every 8 (eight) hours as needed (for pain.).     levothyroxine (SYNTHROID, LEVOTHROID) 88 MCG tablet Take 88 mcg by mouth daily before breakfast.      meclizine (ANTIVERT)  25 MG tablet Take 25 mg by mouth 4 (four) times daily.     Multiple Vitamins-Minerals (CENTRUM SILVER PO) Take 1 tablet by mouth daily at 12 noon.      No current facility-administered medications for this visit.    PHYSICAL EXAMINATION: ECOG PERFORMANCE STATUS: 0 - Asymptomatic  Vitals:   10/12/21 0905  BP: 116/68  Pulse: 70  Resp: 17  Temp: 98.3 F (36.8 C)  SpO2: 99%   Wt Readings from Last 3 Encounters:  10/12/21 192 lb 6.4 oz (87.3 kg)  10/07/20 191 lb 5 oz (86.8 kg)  10/08/19 181 lb 3.2 oz (82.2 kg)     GENERAL:alert, no distress and comfortable SKIN: skin color, texture, turgor are normal, no rashes or significant lesions EYES: normal, Conjunctiva are pink and non-injected, sclera clear  NECK: supple, thyroid normal size, non-tender, without nodularity LYMPH:  no palpable lymphadenopathy in the cervical, axillary  LUNGS: clear to  auscultation and percussion with normal breathing effort HEART: regular rate & rhythm and no murmurs and no lower extremity edema ABDOMEN:abdomen soft, non-tender and normal bowel sounds Musculoskeletal:no cyanosis of digits and no clubbing  NEURO: alert & oriented x 3 with fluent speech, no focal motor/sensory deficits BREAST: No palpable mass, nodules or adenopathy bilaterally. Breast exam benign.   LABORATORY DATA:  I have reviewed the data as listed CBC Latest Ref Rng & Units 10/12/2021 10/07/2020 10/08/2019  WBC 4.0 - 10.5 K/uL 5.6 6.1 5.5  Hemoglobin 12.0 - 15.0 g/dL 12.2 13.2 13.2  Hematocrit 36.0 - 46.0 % 37.4 40.2 40.8  Platelets 150 - 400 K/uL 335 301 327     CMP Latest Ref Rng & Units 10/12/2021 10/07/2020 10/08/2019  Glucose 70 - 99 mg/dL 95 83 79  BUN 8 - 23 mg/dL _0 Creatinine 0.44 - 1.00 mg/dL 0.90 0.86 0.83  Sodium 135 - 145 mmol/L 138 138 141  Potassium 3.5 - 5.1 mmol/L 4.4 3.7 4.4  Chloride 98 - 111 mmol/L 105 104 105  CO2 22 - 32 mmol/L _1 Calcium 8.9 - 10.3 mg/dL 9.8 9.9 9.7  Total Protein 6.5 - 8.1 g/dL 7.6 7.8 7.5  Total Bilirubin 0.3 - 1.2 mg/dL 0.4 0.3 0.4  Alkaline Phos 38 - 126 U/L 57 64 69  AST 15 - 41 U/L _2 ALT 0 - 44 U/L _3 RADIOGRAPHIC STUDIES: I have personally reviewed the radiological images as listed and agreed with the findings in the report. No results found.    Orders Placed This Encounter  Procedures   MR BREAST BILATERAL W WO CONTRAST INC CAD    [employer]  Epic order PF:05/10/21 BCG  CYCLE:NO Dx: Ductal carcinoma in situ  Wt 187 Ht 5'9 / NO needs/ NO Claus/No to all covid ?'s/Aware of CX fee/NO Metal in eyes or removed/  NO implants inside or outside,BB's, bullets or glucose monitors , defibrillators,  stimulator, or injectors or pacemaker,  brain anuerysm clip, or Port/ No hx of sx to the breast , brain, heart, eyes or ears/NKDA to contrast/ No renal Failure, Not on Dialysis/ w/ Pt [facility] 10/11/2021    Standing  Status:   Future    Standing Expiration Date:   10/13/2022    Order Specific Question:   If indicated for the ordered procedure, I authorize the administration of contrast media per Radiology protocol    Answer:   Yes    Order Specific Question:  What is the patient's sedation requirement?    Answer:   No Sedation    Order Specific Question:   Does the patient have a pacemaker or implanted devices?    Answer:   No    Order Specific Question:   Radiology Contrast Protocol - do NOT remove file path    Answer:   [email or web address]    Order Specific Question:   Preferred imaging location?    Answer:   [facility] (table limit-550lbs)   MM Digital Screening Unilat L    Standing Status:   Future    Standing Expiration Date:   10/12/2022    Order Specific Question:   Reason for Exam (SYMPTOM  OR DIAGNOSIS REQUIRED)    Answer:   screening    Order Specific Question:   Preferred imaging location?    Answer:   [facility]   All questions were answered. The patient knows to call the clinic with any problems, questions or concerns. No barriers to learning was detected. The total time spent in the appointment was 25 minutes.     Truitt Merle, MD 10/12/2021   I, Wilburn Mylar, am acting as scribe for Truitt Merle, MD.   I have reviewed the above documentation for accuracy and completeness, and I agree with the above.

## 2021-10-18 ENCOUNTER — Other Ambulatory Visit: Payer: BC Managed Care – PPO

## 2021-10-18 ENCOUNTER — Ambulatory Visit: Payer: BC Managed Care – PPO | Admitting: Hematology

## 2021-11-11 ENCOUNTER — Other Ambulatory Visit: Payer: Self-pay | Admitting: Hematology

## 2021-11-30 ENCOUNTER — Ambulatory Visit
Admission: RE | Admit: 2021-11-30 | Discharge: 2021-11-30 | Disposition: A | Discharge status: Home / Self Care | Payer: BC Managed Care – PPO | Source: Ambulatory Visit | Attending: Hematology | Admitting: Hematology

## 2021-11-30 DIAGNOSIS — D0511 Intraductal carcinoma in situ of right breast: Secondary | ICD-10-CM

## 2021-11-30 MED ORDER — GADOBUTROL 1 MMOL/ML IV SOLN
8.0000 mL | Freq: Once | INTRAVENOUS | Status: AC | PRN
  Administered 2021-11-30: 8 mL via INTRAVENOUS

## 2022-05-11 ENCOUNTER — Ambulatory Visit
Admission: RE | Admit: 2022-05-11 | Discharge: 2022-05-11 | Disposition: A | Discharge status: Home / Self Care | Payer: BC Managed Care – PPO | Source: Ambulatory Visit | Attending: Hematology | Admitting: Hematology

## 2022-05-11 DIAGNOSIS — Z1231 Encounter for screening mammogram for malignant neoplasm of breast: Secondary | ICD-10-CM

## 2022-05-11 HISTORY — DX: Malignant neoplasm of unspecified site of unspecified female breast: C50.919

## 2022-08-20 ENCOUNTER — Encounter: Payer: Self-pay | Admitting: Hematology

## 2022-08-23 ENCOUNTER — Other Ambulatory Visit: Payer: Self-pay | Admitting: Family Medicine

## 2022-08-23 DIAGNOSIS — Z853 Personal history of malignant neoplasm of breast: Secondary | ICD-10-CM

## 2022-11-23 ENCOUNTER — Encounter: Payer: Self-pay | Admitting: Family Medicine

## 2022-12-03 ENCOUNTER — Ambulatory Visit
Admission: RE | Admit: 2022-12-03 | Discharge: 2022-12-03 | Disposition: A | Discharge status: Home / Self Care | Payer: Medicare PPO | Source: Ambulatory Visit | Attending: Family Medicine | Admitting: Family Medicine

## 2022-12-03 DIAGNOSIS — Z853 Personal history of malignant neoplasm of breast: Secondary | ICD-10-CM

## 2022-12-03 MED ORDER — GADOPICLENOL 0.5 MMOL/ML IV SOLN
9.0000 mL | Freq: Once | INTRAVENOUS | Status: AC | PRN
  Administered 2022-12-03: 9 mL via INTRAVENOUS

## 2023-02-05 DIAGNOSIS — N958 Other specified menopausal and perimenopausal disorders: Secondary | ICD-10-CM | POA: Diagnosis not present

## 2023-02-05 DIAGNOSIS — Z8262 Family history of osteoporosis: Secondary | ICD-10-CM | POA: Diagnosis not present

## 2023-02-05 DIAGNOSIS — M8588 Other specified disorders of bone density and structure, other site: Secondary | ICD-10-CM | POA: Diagnosis not present

## 2023-03-28 ENCOUNTER — Other Ambulatory Visit: Payer: Self-pay | Admitting: Hematology

## 2023-03-28 DIAGNOSIS — Z1231 Encounter for screening mammogram for malignant neoplasm of breast: Secondary | ICD-10-CM

## 2023-04-30 DIAGNOSIS — D2372 Other benign neoplasm of skin of left lower limb, including hip: Secondary | ICD-10-CM | POA: Diagnosis not present

## 2023-04-30 DIAGNOSIS — L579 Skin changes due to chronic exposure to nonionizing radiation, unspecified: Secondary | ICD-10-CM | POA: Diagnosis not present

## 2023-04-30 DIAGNOSIS — D229 Melanocytic nevi, unspecified: Secondary | ICD-10-CM | POA: Diagnosis not present

## 2023-05-11 DIAGNOSIS — H2513 Age-related nuclear cataract, bilateral: Secondary | ICD-10-CM | POA: Diagnosis not present

## 2023-05-13 ENCOUNTER — Ambulatory Visit
Admission: RE | Admit: 2023-05-13 | Discharge: 2023-05-13 | Disposition: A | Discharge status: Home / Self Care | Payer: Medicare PPO | Source: Ambulatory Visit | Attending: Hematology | Admitting: Hematology

## 2023-05-13 DIAGNOSIS — Z1231 Encounter for screening mammogram for malignant neoplasm of breast: Secondary | ICD-10-CM

## 2023-08-23 DIAGNOSIS — Z7185 Encounter for immunization safety counseling: Secondary | ICD-10-CM | POA: Diagnosis not present

## 2023-08-23 DIAGNOSIS — E039 Hypothyroidism, unspecified: Secondary | ICD-10-CM | POA: Diagnosis not present

## 2023-08-23 DIAGNOSIS — Z1159 Encounter for screening for other viral diseases: Secondary | ICD-10-CM | POA: Diagnosis not present

## 2023-08-23 DIAGNOSIS — G43009 Migraine without aura, not intractable, without status migrainosus: Secondary | ICD-10-CM | POA: Diagnosis not present

## 2023-08-23 DIAGNOSIS — Z0000 Encounter for general adult medical examination without abnormal findings: Secondary | ICD-10-CM | POA: Diagnosis not present

## 2023-08-23 DIAGNOSIS — E559 Vitamin D deficiency, unspecified: Secondary | ICD-10-CM | POA: Diagnosis not present

## 2023-08-23 DIAGNOSIS — Z1231 Encounter for screening mammogram for malignant neoplasm of breast: Secondary | ICD-10-CM | POA: Diagnosis not present

## 2023-08-23 DIAGNOSIS — Z13220 Encounter for screening for lipoid disorders: Secondary | ICD-10-CM | POA: Diagnosis not present

## 2023-08-23 DIAGNOSIS — Z1211 Encounter for screening for malignant neoplasm of colon: Secondary | ICD-10-CM | POA: Diagnosis not present

## 2023-08-23 DIAGNOSIS — Z853 Personal history of malignant neoplasm of breast: Secondary | ICD-10-CM | POA: Diagnosis not present

## 2023-08-23 DIAGNOSIS — Z136 Encounter for screening for cardiovascular disorders: Secondary | ICD-10-CM | POA: Diagnosis not present

## 2023-10-22 DIAGNOSIS — Z6829 Body mass index (BMI) 29.0-29.9, adult: Secondary | ICD-10-CM | POA: Diagnosis not present

## 2023-10-22 DIAGNOSIS — Z124 Encounter for screening for malignant neoplasm of cervix: Secondary | ICD-10-CM | POA: Diagnosis not present

## 2023-10-22 DIAGNOSIS — Z1151 Encounter for screening for human papillomavirus (HPV): Secondary | ICD-10-CM | POA: Diagnosis not present

## 2023-11-25 ENCOUNTER — Other Ambulatory Visit: Payer: Self-pay | Admitting: Family Medicine

## 2023-11-25 DIAGNOSIS — Z853 Personal history of malignant neoplasm of breast: Secondary | ICD-10-CM

## 2023-12-31 ENCOUNTER — Ambulatory Visit
Admission: RE | Admit: 2023-12-31 | Discharge: 2023-12-31 | Disposition: A | Discharge status: Home / Self Care | Source: Ambulatory Visit | Attending: Family Medicine | Admitting: Family Medicine

## 2023-12-31 DIAGNOSIS — Z1239 Encounter for other screening for malignant neoplasm of breast: Secondary | ICD-10-CM | POA: Diagnosis not present

## 2023-12-31 DIAGNOSIS — Z853 Personal history of malignant neoplasm of breast: Secondary | ICD-10-CM | POA: Diagnosis not present

## 2023-12-31 MED ORDER — GADOPICLENOL 0.5 MMOL/ML IV SOLN
9.0000 mL | Freq: Once | INTRAVENOUS | Status: AC | PRN
  Administered 2023-12-31: 9 mL via INTRAVENOUS

## 2024-01-21 DIAGNOSIS — H2513 Age-related nuclear cataract, bilateral: Secondary | ICD-10-CM | POA: Diagnosis not present

## 2024-01-21 DIAGNOSIS — H524 Presbyopia: Secondary | ICD-10-CM | POA: Diagnosis not present

## 2024-03-17 ENCOUNTER — Other Ambulatory Visit: Payer: Self-pay | Admitting: Medical Genetics

## 2024-04-15 ENCOUNTER — Other Ambulatory Visit: Payer: Self-pay | Admitting: Obstetrics and Gynecology

## 2024-04-15 DIAGNOSIS — Z1231 Encounter for screening mammogram for malignant neoplasm of breast: Secondary | ICD-10-CM

## 2024-06-09 ENCOUNTER — Ambulatory Visit
Admission: RE | Admit: 2024-06-09 | Discharge: 2024-06-09 | Disposition: A | Discharge status: Home / Self Care | Source: Ambulatory Visit | Attending: Obstetrics and Gynecology | Admitting: Obstetrics and Gynecology

## 2024-06-09 DIAGNOSIS — Z1231 Encounter for screening mammogram for malignant neoplasm of breast: Secondary | ICD-10-CM

## 2024-06-11 ENCOUNTER — Other Ambulatory Visit: Payer: Self-pay | Admitting: Medical Genetics

## 2024-06-11 ENCOUNTER — Ambulatory Visit

## 2024-06-11 DIAGNOSIS — Z006 Encounter for examination for normal comparison and control in clinical research program: Secondary | ICD-10-CM

## 2024-08-29 ENCOUNTER — Other Ambulatory Visit (HOSPITAL_BASED_OUTPATIENT_CLINIC_OR_DEPARTMENT_OTHER): Payer: Self-pay

## 2024-08-29 DIAGNOSIS — Z13220 Encounter for screening for lipoid disorders: Secondary | ICD-10-CM

## 2024-09-28 ENCOUNTER — Other Ambulatory Visit (HOSPITAL_BASED_OUTPATIENT_CLINIC_OR_DEPARTMENT_OTHER)
